# Patient Record
Sex: Female | Born: 1990
Health system: Southern US, Community
[De-identification: ages and names within clinical notes are randomized; demographics above are authoritative.]

## PROBLEM LIST (undated history)

## (undated) DIAGNOSIS — M549 Dorsalgia, unspecified: Secondary | ICD-10-CM

## (undated) DIAGNOSIS — I639 Cerebral infarction, unspecified: Secondary | ICD-10-CM

## (undated) DIAGNOSIS — E119 Type 2 diabetes mellitus without complications: Secondary | ICD-10-CM

## (undated) DIAGNOSIS — D571 Sickle-cell disease without crisis: Secondary | ICD-10-CM

## (undated) DIAGNOSIS — G8929 Other chronic pain: Secondary | ICD-10-CM

## (undated) HISTORY — PX: CHOLECYSTECTOMY: SHX55

## (undated) HISTORY — PX: BONE MARROW TRANSPLANT: SHX200

## (undated) HISTORY — PX: APPENDECTOMY: SHX54

## (undated) HISTORY — PX: UPPER GASTROINTESTINAL ENDOSCOPY: SHX188

---
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
## 2015-07-09 LAB — HEPATIC FUNCTION PANEL
ALT: 14 U/L (ref 7–35)
AST: 18 U/L (ref 13–35)
Alkaline Phosphatase: 99 U/L (ref 25–125)
BILIRUBIN, TOTAL: 0.8 mg/dL

## 2015-07-09 LAB — LIPID PANEL
Cholesterol: 131 mg/dL (ref 0–200)
HDL: 50 mg/dL (ref 35–70)
LDL CALC: 61 mg/dL
Triglycerides: 100 mg/dL (ref 40–160)

## 2015-07-09 LAB — BASIC METABOLIC PANEL
BUN: 7 mg/dL (ref 4–21)
Creatinine: 0.7 mg/dL (ref 0.5–1.1)
GLUCOSE: 275 mg/dL
Potassium: 4.4 mmol/L (ref 3.4–5.3)
SODIUM: 135 mmol/L — AB (ref 137–147)

## 2015-09-01 ENCOUNTER — Emergency Department (HOSPITAL_COMMUNITY)
Admission: EM | Admit: 2015-09-01 | Discharge: 2015-09-01 | Disposition: A | Discharge status: Home / Self Care | Payer: Medicare Other | Attending: Emergency Medicine | Admitting: Emergency Medicine

## 2015-09-01 ENCOUNTER — Encounter (HOSPITAL_COMMUNITY): Payer: Self-pay | Admitting: *Deleted

## 2015-09-01 DIAGNOSIS — E109 Type 1 diabetes mellitus without complications: Secondary | ICD-10-CM | POA: Insufficient documentation

## 2015-09-01 DIAGNOSIS — Z862 Personal history of diseases of the blood and blood-forming organs and certain disorders involving the immune mechanism: Secondary | ICD-10-CM | POA: Diagnosis not present

## 2015-09-01 DIAGNOSIS — G8929 Other chronic pain: Secondary | ICD-10-CM | POA: Insufficient documentation

## 2015-09-01 DIAGNOSIS — Z87891 Personal history of nicotine dependence: Secondary | ICD-10-CM | POA: Diagnosis not present

## 2015-09-01 DIAGNOSIS — R112 Nausea with vomiting, unspecified: Secondary | ICD-10-CM | POA: Diagnosis not present

## 2015-09-01 DIAGNOSIS — Z3202 Encounter for pregnancy test, result negative: Secondary | ICD-10-CM | POA: Insufficient documentation

## 2015-09-01 DIAGNOSIS — Z8673 Personal history of transient ischemic attack (TIA), and cerebral infarction without residual deficits: Secondary | ICD-10-CM | POA: Diagnosis not present

## 2015-09-01 DIAGNOSIS — M545 Low back pain: Secondary | ICD-10-CM | POA: Diagnosis not present

## 2015-09-01 DIAGNOSIS — M549 Dorsalgia, unspecified: Secondary | ICD-10-CM

## 2015-09-01 HISTORY — DX: Type 2 diabetes mellitus without complications: E11.9

## 2015-09-01 HISTORY — DX: Dorsalgia, unspecified: M54.9

## 2015-09-01 HISTORY — DX: Sickle-cell disease without crisis: D57.1

## 2015-09-01 HISTORY — DX: Cerebral infarction, unspecified: I63.9

## 2015-09-01 HISTORY — DX: Other chronic pain: G89.29

## 2015-09-01 LAB — URINALYSIS, ROUTINE W REFLEX MICROSCOPIC
Bilirubin Urine: NEGATIVE
Glucose, UA: NEGATIVE mg/dL
HGB URINE DIPSTICK: NEGATIVE
Ketones, ur: NEGATIVE mg/dL
Leukocytes, UA: NEGATIVE
NITRITE: NEGATIVE
PH: 6 (ref 5.0–8.0)
Protein, ur: NEGATIVE mg/dL
SPECIFIC GRAVITY, URINE: 1.014 (ref 1.005–1.030)

## 2015-09-01 LAB — POC URINE PREG, ED: Preg Test, Ur: NEGATIVE

## 2015-09-01 MED ORDER — OXYCODONE-ACETAMINOPHEN 5-325 MG PO TABS
1.0000 | ORAL_TABLET | Freq: Once | ORAL | Status: AC
  Administered 2015-09-01: 1 via ORAL

## 2015-09-01 MED ORDER — KETOROLAC TROMETHAMINE 30 MG/ML IJ SOLN
30.0000 mg | Freq: Once | INTRAMUSCULAR | Status: AC
  Administered 2015-09-01: 30 mg via INTRAMUSCULAR
  Filled 2015-09-01: qty 1

## 2015-09-01 MED ORDER — METHOCARBAMOL 500 MG PO TABS
500.0000 mg | ORAL_TABLET | Freq: Once | ORAL | Status: AC
  Administered 2015-09-01: 500 mg via ORAL
  Filled 2015-09-01: qty 1

## 2015-09-01 MED ORDER — METHOCARBAMOL 500 MG PO TABS
500.0000 mg | ORAL_TABLET | Freq: Two times a day (BID) | ORAL | Status: DC
Start: 2015-09-01 — End: 2015-09-09

## 2015-09-01 MED ORDER — OXYCODONE-ACETAMINOPHEN 5-325 MG PO TABS
1.0000 | ORAL_TABLET | Freq: Once | ORAL | Status: AC
  Administered 2015-09-01: 1 via ORAL
  Filled 2015-09-01: qty 1

## 2015-09-01 MED ORDER — OXYCODONE-ACETAMINOPHEN 5-325 MG PO TABS
ORAL_TABLET | ORAL | Status: AC
  Filled 2015-09-01: qty 1

## 2015-09-01 MED ORDER — ACETAMINOPHEN 500 MG PO TABS: 500.0000 mg | ORAL_TABLET | Freq: Four times a day (QID) | ORAL | Status: DC | PRN

## 2015-09-01 MED ORDER — IBUPROFEN 800 MG PO TABS
800.0000 mg | ORAL_TABLET | Freq: Three times a day (TID) | ORAL | Status: DC
Start: 2015-09-01 — End: 2016-02-16

## 2015-09-01 NOTE — ED Provider Notes (Signed)
CSN: [id number]     Arrival date & time 09/01/15  1458 History  By signing my name below, I, Elon Spanner, attest that this documentation has been prepared under the direction and in the presence of Glean Hess, [place]. Electronically Signed: Elon Spanner ED Scribe. 09/01/2015. 4:59 PM.    Chief Complaint  Patient presents with  . Back Pain    The history is provided by the patient. No language interpreter was used.    HPI Comments: Jo Pittman is a 25 y.o. female with hx of DM, back pain, sickle cell anemia who presents to the Emergency Department complaining of chronic, constant lower back pain that worsened two weeks ago.  She currently also reports a sensation of new, intermittent, left > right muscle spasms. There are no aggravating factors.  She also notes recent nausea and vomiting precipitated by eating.  The patient states she moved from [place] one month ago where she was followed by pain management.  She is scheduled to establish care with a new clinic on 1/24.  The pain has been unrelieved by oxycodone, diclofenac, diazepam, and tylenol.  She ran out of oxycodone recently.  She denies fever, chills, CP, SOB, abdominal pain, numbness/tingling/weakness, bowel/bladder incontinence, dysuria, frequency. She denies hx of CA or use of anti-coagulants.  She states she had a bone marrow transplant after having a stroke in 2008, and no longer has sickle cell anemia. She reports her symptoms are not consistent with pain crisis.   Past Medical History  Diagnosis Date  . Diabetes mellitus without complication (HCC)     type I  . Back pain, chronic   . Sickle cell anemia (HCC)     HX of, no longer  . Stroke [facility]) 2008    R sided weakness/aneurism   Past Surgical History  Procedure Laterality Date  . Bone marrow transplant    . Cholecystectomy    . Appendectomy     No family history on file. Social History  Substance Use Topics  . Smoking status: Former Games developer  . Smokeless  tobacco: None  . Alcohol Use: Yes     Comment: occ   OB History    No data available      Review of Systems  Constitutional: Negative for fever and chills.  Respiratory: Negative for shortness of breath.   Cardiovascular: Negative for chest pain.  Gastrointestinal: Positive for nausea and vomiting. Negative for abdominal pain.  Genitourinary: Negative for dysuria and frequency.  Musculoskeletal: Positive for back pain.  Neurological: Negative for syncope, weakness and numbness.  All other systems reviewed and are negative.    Allergies  Carafate and Vancomycin  Home Medications   Prior to Admission medications   Medication Sig Start Date End Date Taking? Authorizing Provider  acetaminophen (TYLENOL) 500 MG tablet Take 1 tablet (500 mg total) by mouth every 6 (six) hours as needed. 09/01/15   Mady Gemma, PA-C  ibuprofen (ADVIL,MOTRIN) 800 MG tablet Take 1 tablet (800 mg total) by mouth 3 (three) times daily. 09/01/15   Mady Gemma, PA-C  methocarbamol (ROBAXIN) 500 MG tablet Take 1 tablet (500 mg total) by mouth 2 (two) times daily. 09/01/15   Mady Gemma, PA-C    BP 102/74 mmHg  Pulse 84  Temp(Src) 98.7 F (37.1 C) (Oral)  Resp 20  Ht 5\' 6"  (1.676 m)  Wt 45.36 kg  BMI 16.15 kg/m2  SpO2 100%  LMP 08/17/2015 Physical Exam  Constitutional: She is oriented to person, place,  and time. She appears well-developed and well-nourished. No distress.  HENT:  Head: Normocephalic and atraumatic.  Right Ear: External ear normal.  Left Ear: External ear normal.  Nose: Nose normal.  Mouth/Throat: Uvula is midline, oropharynx is clear and moist and mucous membranes are normal.  Eyes: Conjunctivae, EOM and lids are normal. Pupils are equal, round, and reactive to light. Right eye exhibits no discharge. Left eye exhibits no discharge. No scleral icterus.  Neck: Normal range of motion. Neck supple.  Cardiovascular: Normal rate, regular rhythm, normal heart  sounds, intact distal pulses and normal pulses.   Pulmonary/Chest: Effort normal and breath sounds normal. No respiratory distress. She has no wheezes. She has no rales.  Abdominal: Soft. Normal appearance and bowel sounds are normal. She exhibits no distension and no mass. There is no tenderness. There is no rigidity, no rebound and no guarding.  Musculoskeletal: Normal range of motion. She exhibits tenderness. She exhibits no edema.  TTP of left lumbar paraspinal muscles. No midline tenderness, step-off, or deformity.   Neurological: She is alert and oriented to person, place, and time. She has normal strength and normal reflexes. No sensory deficit.  Skin: Skin is warm, dry and intact. No rash noted. She is not diaphoretic. No erythema. No pallor.  Psychiatric: She has a normal mood and affect. Her speech is normal and behavior is normal.  Nursing note and vitals reviewed.   ED Course  Procedures (including critical care time)  DIAGNOSTIC STUDIES: Oxygen Saturation is 99% on RA, normal by my interpretation.    COORDINATION OF CARE: 5:06 PM Will order pain medication, muscle relaxant, and labs.  Patient acknowledges and agrees with plan.    Labs Review Labs Reviewed  URINALYSIS, ROUTINE W REFLEX MICROSCOPIC (NOT AT [facility])  POC URINE PREG, ED    Imaging Review No results found.   I have personally reviewed and evaluated these lab results as part of my medical decision-making.   EKG Interpretation None      MDM   Final diagnoses:  Back pain, unspecified location    25 year old female presents with left-sided back pain. States she has chronic back pain, and recently ran out of her oxycodone, which was prescribed by her pain clinic in [place]. She reports worsening pain with associated muscle spasms. She denies fever, chills, bowel or bladder incontinence, saddle anesthesia, numbness, weakness, paresthesia, history of malignancy, IV drug use, anticoagulant use.  Patient is  afebrile. Vital signs stable. On exam, she has tenderness to palpation of her left lumbar paraspinal muscles. No midline tenderness, step-off, or deformity. Strength, sensation, DTRs intact. Patient ambulates without difficulty.  UA negative for infection. Urine pregnancy negative.  Patient given percocet, robaxin, toradol for pain. She reports significant symptom improvement, and states she is now comfortable.   Patient is nontoxic and well-appearing, feel she is stable for discharge at this time. Doubt cauda equina, abscess, hematoma. Symptoms likely muscular with chronic pain contributing. Patient to follow-up with PCP as scheduled for possible referral to pain clinic in [place]. Recommended patient take tylenol or ibuprofen for pain at home. Will also give muscle relaxant for muscle spasm. Return precautions discussed. Patient verbalizes her understanding and is in agreement with plan.  BP 102/74 mmHg  Pulse 84  Temp(Src) 98.7 F (37.1 C) (Oral)  Resp 20  Ht 5\' 6"  (1.676 m)  Wt 45.36 kg  BMI 16.15 kg/m2  SpO2 100%  LMP 08/17/2015  I personally performed the services described in this documentation, which was scribed  in my presence. The recorded information has been reviewed and is accurate.    Mady Gemmalizabeth C Shericka Johnstone, PA-C 09/01/15 1854  Leta BaptistEmily Roe Nguyen, MD 09/05/15 [phone]

## 2015-09-01 NOTE — ED Notes (Addendum)
[place]  [place] pain to L hip and has run out of all her pain meds except for muscle relaxers.  Pt normally takes oxycodone 15 mg for pain.  Hx of sickle cell - pt states she no longer has it d/t a bone marrow transplant.

## 2015-09-01 NOTE — Discharge Instructions (Signed)
1. Medications: tylenol, ibuprofen, and robaxin for pain, usual home medications 2. Treatment: rest, drink plenty of fluids, use heat/ice 3. Follow Up: please followup with your primary doctor as scheduled on the 24th for discussion of your diagnoses and further evaluation after today's visit; if you do not have a primary care doctor use the resource guide provided to find one; please return to the ER for increased pain, numbness, weakness, new or worsening symptoms   Back Pain, Adult Back pain is very common in adults.The cause of back pain is rarely dangerous and the pain often gets better over time.The cause of your back pain may not be known. Some common causes of back pain include:  Strain of the muscles or ligaments supporting the spine.  Wear and tear (degeneration) of the spinal disks.  Arthritis.  Direct injury to the back. For many people, back pain may return. Since back pain is rarely dangerous, most people can learn to manage this condition on their own. HOME CARE INSTRUCTIONS Watch your back pain for any changes. The following actions may help to lessen any discomfort you are feeling:  Remain active. It is stressful on your back to sit or stand in one place for long periods of time. Do not sit, drive, or stand in one place for more than 30 minutes at a time. Take short walks on even surfaces as soon as you are able.Try to increase the length of time you walk each day.  Exercise regularly as directed by your health care provider. Exercise helps your back heal faster. It also helps avoid future injury by keeping your muscles strong and flexible.  Do not stay in bed.Resting more than 1-2 days can delay your recovery.  Pay attention to your body when you bend and lift. The most comfortable positions are those that put less stress on your recovering back. Always use proper lifting techniques, including:  Bending your knees.  Keeping the load close to your body.  Avoiding  twisting.  Find a comfortable position to sleep. Use a firm mattress and lie on your side with your knees slightly bent. If you lie on your back, put a pillow under your knees.  Avoid feeling anxious or stressed.Stress increases muscle tension and can worsen back pain.It is important to recognize when you are anxious or stressed and learn ways to manage it, such as with exercise.  Take medicines only as directed by your health care provider. Over-the-counter medicines to reduce pain and inflammation are often the most helpful.Your health care provider may prescribe muscle relaxant drugs.These medicines help dull your pain so you can more quickly return to your normal activities and healthy exercise.  Apply ice to the injured area:  Put ice in a plastic bag.  Place a towel between your skin and the bag.  Leave the ice on for 20 minutes, 2-3 times a day for the first 2-3 days. After that, ice and heat may be alternated to reduce pain and spasms.  Maintain a healthy weight. Excess weight puts extra stress on your back and makes it difficult to maintain good posture. SEEK MEDICAL CARE IF:  You have pain that is not relieved with rest or medicine.  You have increasing pain going down into the legs or buttocks.  You have pain that does not improve in one week.  You have night pain.  You lose weight.  You have a fever or chills. SEEK IMMEDIATE MEDICAL CARE IF:   You develop new bowel or  bladder control problems.  You have unusual weakness or numbness in your arms or legs.  You develop nausea or vomiting.  You develop abdominal pain.  You feel faint.   This information is not intended to replace advice given to you by your health care provider. Make sure you discuss any questions you have with your health care provider.   Document Released: 08/02/2005 Document Revised: 08/23/2014 Document Reviewed: 12/04/2013 Elsevier Interactive Patient Education 2016 [employer]  [employer]   Emergency Department Resource Guide 1) Find a Doctor and Pay Out of Pocket Although you won't have to find out who is covered by your insurance plan, it is a good idea to ask around and get recommendations. You will then need to call the office and see if the doctor you have chosen will accept you as a new patient and what types of options they offer for patients who are self-pay. Some doctors offer discounts or will set up payment plans for their patients who do not have insurance, but you will need to ask so you aren't surprised when you get to your appointment.  2) Contact Your Local Health Department Not all health departments have doctors that can see patients for sick visits, but many do, so it is worth a call to see if yours does. If you don't know where your local health department is, you can check in your phone book. The CDC also has a tool to help you locate your state's health department, and many state websites also have listings of all of their local health departments.  3) Find a Walk-in Clinic If your illness is not likely to be very severe or complicated, you may want to try a walk in clinic. These are popping up all over the country in pharmacies, drugstores, and shopping centers. They're usually staffed by nurse practitioners or physician assistants that have been trained to treat common illnesses and complaints. They're usually fairly quick and inexpensive. However, if you have serious medical issues or chronic medical problems, these are probably not your best option.  No Primary Care Doctor: - Call Health Connect at  [phone] - they can help you locate a primary care doctor that  accepts your insurance, provides certain services, etc. - Physician Referral Service- [phone]  Chronic Pain Problems: Organization         Address  Phone   Notes  [facility]  [phone]) [phone] Patients need to be referred by their primary care doctor.   Medication  Assistance: Organization         Address  Phone   Notes  [facility] Medication [facility] [address] [place], [place] [address] [phone]) [phone] --Must be a resident of [facility] [place] -- Must have NO insurance coverage whatsoever (no Medicaid/ Medicare, etc.) -- The pt. MUST have a primary care doctor that directs their care regularly and follows them in the community   MedAssist  [phone]   [employer]  [phone]    Agencies that provide inexpensive medical care: Organization         Address  Phone   Notes  [facility]  [phone](336) [phone]   [facility]    [phone]) [phone]   [facility] [address] [place], [place] [address] [phone]) [phone]   [facility] [address], [place] [phone]   Planned Parenthood    [phone](336) [phone]   [facility]    (  [phone]) [phone]   [facility]  [address], [place] Phone:  [phone](336) [phone], Fax:  [phone] Hours of Operation:  9 am - 6 pm, M-F.  Also accepts Medicaid/Medicare and self-pay.  [facility]  [address], [place] Phone: [phone](336) [phone], Fax: [phone]) [phone]. Hours of Operation:  8:30 am - 5:30 pm, M-F.  Also accepts Medicaid and self-pay.  [facility] [place] [address], [place] Phone: [phone](336) [phone]   [facility] [address], [place], [place] [phone], Ext. [phone] Mondays & Thursdays: 7-9 AM.  First 15 patients are seen on a first come, first serve basis.    Medicaid-accepting [facility] Providers:  Organization         Address  Phone   Notes  [facility] [address], [place] [phone] Also accepts self-pay patients.  [facility] [address], [place]  [phone]) [phone]   [facility] [address], [place]  [phone](336) [phone]   [facility] [address] [place] [address], [place] [phone](336) [phone]   Renaye RakersVeita Bland [address], [place]   [phone]) [phone] Only accepts [place] Access [place] patients after they have their name applied to their card.   Self-Pay (no insurance) in [facility]:  Organization         Address  Phone   Notes  Sickle Cell Patients, [facility] [address], [place] [phone](336) [phone]   [facility] [address], [place] [phone]) [phone]   [facility]  [address], [place] [phone]) [phone]   [facility]/Dr. Osei-Bonsu  [address] [place] [address], [place] or [id number], [place] [phone]) [phone] Phone number for both [place] and [place] locations is the same.  [facility] [address], [place] [phone](336) [phone]   [facility] [address] [place] [address], [place] or [address] [phone](336) [phone]) [phone]   [facility] [address], [place] [phone](336) [phone], phone; [phone](336) [phone], fax Sees patients 1st and 3rd Saturday of every month.  Must not qualify for public or private insurance (i.e. Medicaid, Medicare, [place] Health Choice, Veterans' Benefits)  Household income should be no more than 200% of the poverty level The clinic cannot treat you if you are pregnant or think you are pregnant  Sexually transmitted diseases are not treated at the clinic.    Dental Care: Organization         Address  Phone  Notes  [facility] Department of [facility] [address], [place] [phone]) [phone] Accepts children up to age 26 who are enrolled in [place]icaid or [place] Health Choice; pregnant women with a Medicaid card; and children who have applied for Medicaid or [place] Health Choice, but were declined, whose parents can pay a reduced fee at time of service.  [facility]  Department of [facility] [place]  [address], [place] [phone] Accepts children up to age 26 who are enrolled in [place]icaid or [place] Health Choice; pregnant women with a Medicaid card; and children who have applied for Medicaid or [place] Health Choice, but were declined, whose parents can pay a reduced fee at time of service.  [facility]  [address] [place], [place] [phone] Patients are seen by appointment only. Walk-ins are not accepted. [facility] will see patients 15 years of age and older. Monday - Tuesday (8am-5pm) Most Wednesdays (8:30-5pm) $30 per visit, cash only  [facility] Access PRO[facility]  [address], [facility] [phone] Patients are seen by appointment only. Walk-ins are not accepted. [facility] will see patients 64 years of age and older. One Wednesday Evening (Monthly: Volunteer Based).  $30 per visit, cash only  [employer]  [phone] for adults; Children under age 75, call Graduate Pediatric Dentistry at [phone]. Children aged 86-14, please call [phone] to request a pediatric application.  Dental services are provided in all areas of dental care including fillings, crowns and bridges, complete and partial dentures, implants, gum treatment, root canals, and extractions. Preventive care is also provided. Treatment is provided to both adults and children. Patients are selected via a lottery and there is often a waiting list.   [facility] [address] [place]  [phone] [email or web address]   [facility] [address] [place], [place] [phone], Ext. [phone] Second and Fourth Thursday of each month, opens at 6:30 AM; Clinic ends at 9 AM.  Patients are seen on a first-come first-served basis, and a limited number are seen during each clinic.   [facility]  [address] Ether [place], [place] [phone]    Eligibility Requirements You must have lived in [place], [place], or [place] counties for at least the last three months.   You cannot be eligible for state or federal sponsored [place], including [employer], [place], or [employer].   You generally cannot be eligible for healthcare insurance through your employer.    How to apply: Eligibility screenings are held every Tuesday and Wednesday afternoon from 1:00 pm until 4:00 pm. You do not need an appointment for the interview!  [facility] [address], [place], [place] [phone]   [facility] Health Department  [phone]   [facility] Health Department  [phone]   [facility] Health Department  [phone]    Behavioral Health Resources in the Community: Intensive Outpatient Programs Organization         Address  Phone  Notes  [facility] [address] [place], [place] [phone]   [facility]patient [address], [place], [place] [phone]   [facility]: Alcohol [facility] Drug S[facility]cs [address], [place], [place]  [phone]   [facility] Mental Health [address],  [place], [place] [phone] or [phone]   Substance Abuse Resources Organization         Address  Phone  Notes  [facility] Drug Services  [phone]   [facility]  [phone]   [facility] [place]  [phone]   Floydene Flock  [phone]   Residential & Outpatient Substance Abuse Program  [phone]   Psychological Services Organization         Address  Phone  Notes  [facility] Behavioral Health  [phone]   [facility]  [phone]   [facility] Mental Health [address], [place] [phone] or [phone]    Mobile Crisis Teams Organization         Address  Phone  Notes  [facility], Mobile Crisis Care Unit  [phone]   [facility]  [address]  [place],  [place] [phone]   [facility] [address] [place] [phone]    Self-Help/Support Groups Organization         Address  Phone             Notes  Mental Health Assoc. of [place] - variety of support groups  336- [phone] Call for more information  Narcotics Anonymous (NA), [facility] [address], [place]  2 meetings at this location   Statisticianesidential Treatment Programs Organization         Address  Phone  Notes  [facility] [address],    [place]  [phone]   [facility]  [address], [place] [phone], [place], [place] [phone]   [facility] [address], [place] [phone] Admissions: 8am-3pm M-F  [facility] [address]    [place], [place] [phone]   [facility] [address], [place], [place] [phone]   [facility] [address]  [place], [place] [phone]   [facility] - Intensive Outpatient [address], [place], [place] [phone]   [facility] ([facility] [address]  [place], [place] [phone] or [phone]   [facility] ([facility]) [address] [place], [place] [phone] Accepts Medicaid  [facility] [address]  [place] [phone] Substance Abuse/Addiction Treatment   [facility] Behavioral Health Resources Organization         Address  Phone  Notes  [facility]  [phone]) [phone]   Angie FavaJulie Brannon, PhD [address], [address] [place], [place]   [phone]) [phone] or [phone](336) [phone]   [facility]   [address] [place], [place] [phone](336) [phone]   [facility] [address], [place], [place] [phone](336) [phone] Insurance/Medicaid/sponsorship through [facility]  Faith and Families [address]                                    [place], [place] [phone](336) [phone] Therapy/tele-psych/case    [facility] [address].   [place], [place] [phone](336) [phone]    Dr. Lolly MustacheArfeen  [phone](336) [phone]   [facility] [place]  [employer] [facility]. 1) [address],  2) [address], [place] 3)  [address], [place] [phone]) [phone]) [phone]  [phone](336) [phone]   [facility] Child Abuse Hotline [phone](336) [phone] or [phone](336) [phone] (After Hours)

## 2015-09-09 ENCOUNTER — Ambulatory Visit (INDEPENDENT_AMBULATORY_CARE_PROVIDER_SITE_OTHER): Payer: Medicare Other | Admitting: Family Medicine

## 2015-09-09 ENCOUNTER — Encounter: Payer: Self-pay | Admitting: Family Medicine

## 2015-09-09 VITALS — BP 121/77 | HR 108 | Temp 98.2°F | Resp 14 | Ht 66.0 in | Wt 101.0 lb

## 2015-09-09 DIAGNOSIS — M5440 Lumbago with sciatica, unspecified side: Secondary | ICD-10-CM | POA: Insufficient documentation

## 2015-09-09 DIAGNOSIS — J4520 Mild intermittent asthma, uncomplicated: Secondary | ICD-10-CM

## 2015-09-09 DIAGNOSIS — F411 Generalized anxiety disorder: Secondary | ICD-10-CM

## 2015-09-09 DIAGNOSIS — Z862 Personal history of diseases of the blood and blood-forming organs and certain disorders involving the immune mechanism: Secondary | ICD-10-CM

## 2015-09-09 DIAGNOSIS — G894 Chronic pain syndrome: Secondary | ICD-10-CM | POA: Insufficient documentation

## 2015-09-09 DIAGNOSIS — E109 Type 1 diabetes mellitus without complications: Secondary | ICD-10-CM

## 2015-09-09 DIAGNOSIS — F32A Depression, unspecified: Secondary | ICD-10-CM | POA: Insufficient documentation

## 2015-09-09 DIAGNOSIS — E559 Vitamin D deficiency, unspecified: Secondary | ICD-10-CM

## 2015-09-09 DIAGNOSIS — F329 Major depressive disorder, single episode, unspecified: Secondary | ICD-10-CM | POA: Diagnosis not present

## 2015-09-09 DIAGNOSIS — M5441 Lumbago with sciatica, right side: Secondary | ICD-10-CM

## 2015-09-09 DIAGNOSIS — R42 Dizziness and giddiness: Secondary | ICD-10-CM

## 2015-09-09 LAB — COMPLETE METABOLIC PANEL WITH GFR
ALT: 15 U/L (ref 6–29)
AST: 18 U/L (ref 10–30)
Albumin: 5.1 g/dL (ref 3.6–5.1)
Alkaline Phosphatase: 115 U/L (ref 33–115)
BUN: 8 mg/dL (ref 7–25)
CHLORIDE: 102 mmol/L (ref 98–110)
CO2: 27 mmol/L (ref 20–31)
CREATININE: 0.71 mg/dL (ref 0.50–1.10)
Calcium: 10 mg/dL (ref 8.6–10.2)
GFR, Est Non African American: >89 mL/min (ref >=60)
Glucose, Bld: 48 mg/dL — ABNORMAL LOW (ref 65–99)
Potassium: 3.8 mmol/L (ref 3.5–5.3)
Sodium: 138 mmol/L (ref 135–146)
Total Bilirubin: 0.5 mg/dL (ref 0.2–1.2)
Total Protein: 7.6 g/dL (ref 6.1–8.1)

## 2015-09-09 LAB — POCT URINALYSIS DIP (DEVICE)
Bilirubin Urine: NEGATIVE
Glucose, UA: 500 mg/dL — AB
HGB URINE DIPSTICK: NEGATIVE
Ketones, ur: NEGATIVE mg/dL
Leukocytes, UA: NEGATIVE
NITRITE: NEGATIVE
PROTEIN: NEGATIVE mg/dL
Specific Gravity, Urine: 1.015 (ref 1.005–1.030)
Urobilinogen, UA: 0.2 mg/dL (ref 0.0–1.0)
pH: 6 (ref 5.0–8.0)

## 2015-09-09 LAB — CBC WITH DIFFERENTIAL/PLATELET
BASOS ABS: 0 10*3/uL (ref 0.0–0.1)
Basophils Relative: 0 % (ref 0–1)
EOS PCT: 0 % (ref 0–5)
Eosinophils Absolute: 0 10*3/uL (ref 0.0–0.7)
HEMATOCRIT: 36.7 % (ref 36.0–46.0)
HEMOGLOBIN: 12.3 g/dL (ref 12.0–15.0)
LYMPHS ABS: 3.3 10*3/uL (ref 0.7–4.0)
LYMPHS PCT: 39 % (ref 12–46)
MCH: 34.2 pg — ABNORMAL HIGH (ref 26.0–34.0)
MCHC: 33.5 g/dL (ref 30.0–36.0)
MCV: 101.9 fL — AB (ref 78.0–100.0)
MPV: 10.3 fL (ref 8.6–12.4)
Monocytes Absolute: 1.1 10*3/uL — ABNORMAL HIGH (ref 0.1–1.0)
Monocytes Relative: 13 % — ABNORMAL HIGH (ref 3–12)
NEUTROS ABS: 4 10*3/uL (ref 1.7–7.7)
Neutrophils Relative %: 48 % (ref 43–77)
Platelets: 201 10*3/uL (ref 150–400)
RBC: 3.6 MIL/uL — ABNORMAL LOW (ref 3.87–5.11)
RDW: 12.7 % (ref 11.5–15.5)
WBC: 8.4 10*3/uL (ref 4.0–10.5)

## 2015-09-09 MED ORDER — KETOROLAC TROMETHAMINE 60 MG/2ML IM SOLN
30.0000 mg | Freq: Once | INTRAMUSCULAR | Status: AC
Start: 2015-09-09 — End: 2015-09-09
  Administered 2015-09-09: 30 mg via INTRAMUSCULAR

## 2015-09-09 MED ORDER — METHOCARBAMOL 500 MG PO TABS: 500.0000 mg | ORAL_TABLET | Freq: Two times a day (BID) | ORAL | Status: DC

## 2015-09-09 NOTE — Progress Notes (Signed)
Subjective:    Patient ID: Jo Pittman, female    DOB: 02-04-1991, 25 y.o.   MRN: [id number]  HPI Ms. Jo Pittman, a 25 year old female with type I diabetes, chronic back pain, depression, sickle cell anemia, and vitamin D deficiency presents to establish care. Ms. Jo Pittman recently relocated from [place]. She states that she was a patient of Dr. Anastasia Pall at [facility]. Patient states that she relocated greater than 1 month ago and has been using the emergency department and urgent care for all primary care needs. Ms. Jo Pittman has a history of sickle cell anemia. She received a bone marrow transplant years ago that was successful.    Patient is also here for an evaluation of Type 1 diabetes mellitus.  The initial diagnosis of diabetes was made as a teenager.  Her clinical course has been stable since being on the insulin pump. Associated symptoms of hyperglycemia have been blurry vision and polyuria.  She currently denies symptoms of hypoglycemia. Patient was under the care of an endocrinologist while living in . She maintains that compliance with glucose monitoring has been good. She states that meal planning has been been poor since relocating.  Ms. Jo Pittman is also complaining of chronic back pain. She maintains that she has a history of degenerative disc disease. She was under the care of pain management. Pain intensity at present is 6/10. She states that she was previously on Oxycontin 15 mg every 12 hours, which controlled pain. She has been out of medication over the past several weeks. She says that she continues to take high dose Ibuprofen and Robaxin for symptoms with minimal relief. She denies any previous back injury.  Exacerbating factors identifiable by patient are bending backwards, bending forwards, bending sideways, recumbency, running, sitting, standing and walking. She says that she has had a back MRI and was previous evaluated by an  orthopedic physician.   Ms. Jo Pittman also has a history of depression and anxiety.   She complains of anhedonia, depressed mood and insomnia. Onset was several years ago.   She denies current suicidal and homicidal plan or intent. Possible organic causes contributing are: endocrine/metabolic.  Risk factors include: previous episode of depression Previous treatment includes Wellbutrin and Xanax and Lexapro.  Patient also has a history of asthma that has been controlled. She was diagnosed with asthma as a child. She maintains that most exacerbations occur during season changes. Symptoms are controlled on symbicort twice daily.  Medications used in the past to treat these symptoms include beta agonist inhalers and inhaled steroids.   Past Medical History  Diagnosis Date  . Diabetes mellitus without complication (HCC)     type I  . Back pain, chronic   . Sickle cell anemia (HCC)     HX of, no longer  . Stroke [facility]) 2008    R sided weakness/aneurism   Past Surgical History  Procedure Laterality Date  . Bone marrow transplant    . Cholecystectomy    . Appendectomy     Social History   Social History  . Marital Status: Single    Spouse Name: N/A  . Number of Children: N/A  . Years of Education: N/A   Occupational History  . Not on file.   Social History Main Topics  . Smoking status: Former Games developer  . Smokeless tobacco: Not on file  . Alcohol Use: 2.4 oz/week    4 Shots of liquor per week     Comment: occ  .  Drug Use: Yes    Special: Marijuana  . Sexual Activity: Not on file   Other Topics Concern  . Not on file   Social History Narrative     Review of Systems  Constitutional: Negative.  Negative for fever.  HENT: Negative.   Eyes: Negative.   Respiratory: Negative.  Negative for apnea, chest tightness, shortness of breath and wheezing.   Cardiovascular: Negative.  Negative for palpitations and leg swelling.  Gastrointestinal: Negative.  Negative for blood in stool and  abdominal distention.  Endocrine: Positive for polyuria. Negative for polydipsia and polyphagia.  Genitourinary: Positive for menstrual problem (abnormal menstrual periods). Negative for dysuria, decreased urine volume and vaginal bleeding.  Musculoskeletal: Positive for myalgias and back pain.  Skin: Negative.   Allergic/Immunologic: Negative.  Negative for immunocompromised state.  Neurological: Negative.  Negative for dizziness and light-headedness.  Hematological: Negative.   Psychiatric/Behavioral: Negative.       Objective:   Physical Exam  Constitutional: She is oriented to person, place, and time. She appears well-developed and well-nourished.  HENT:  Head: Normocephalic and atraumatic.  Right Ear: External ear normal.  Left Ear: External ear normal.  Nose: Nose normal.  Mouth/Throat: Oropharynx is clear and moist.  Eyes: Conjunctivae and EOM are normal. Pupils are equal, round, and reactive to light.  Neck: Normal range of motion. Neck supple.  Cardiovascular: Normal rate, regular rhythm, normal heart sounds and intact distal pulses.   Pulmonary/Chest: Effort normal and breath sounds normal.  Abdominal: Soft. Bowel sounds are normal.  Musculoskeletal:       Lumbar back: She exhibits decreased range of motion. She exhibits no tenderness.  Neurological: She is alert and oriented to person, place, and time. She has normal reflexes.  Skin: Skin is warm and dry.  Psychiatric: She has a normal mood and affect. Her behavior is normal. Judgment and thought content normal.     BP 121/77 mmHg  Pulse 108  Temp(Src) 98.2 F (36.8 C) (Oral)  Resp 14  Ht  (1.676 m)  Wt 101 lb (45.813 kg)  BMI 16.31 kg/m2  LMP 08/22/2015 Assessment & Plan:  1. Type 1 diabetes mellitus without complication (HCC) Ms. Jo Pittman currently has an insulin pump.She receives regular insulin 3 times per day per  Sent a medical records request to [facility], will review records as  they come available. Will send a referral to Dr. Casimiro Needle [place], endocrinologist for further evaluation.  - Hemoglobin A1c - COMPLETE METABOLIC PANEL WITH GFR - POCT urinalysis dipstick  2. Low back pain with right-sided sciatica, unspecified back pain laterality - Ambulatory referral to Pain Clinic - ketorolac (TORADOL) injection 30 mg; Inject 1 mL (30 mg total) into the muscle once. - methocarbamol (ROBAXIN) 500 MG tablet; Take 1 tablet (500 mg total) by mouth 2 (two) times daily.  Dispense: 60 tablet; Refill: 1  3. Chronic pain syndrome - Ambulatory referral to Pain Clinic  4. Depression Continue Welbutrin and Lexapro as previously prescribed. She currently denies suicidal or homicidal intent. Will send a referral for psychiatry. Patient is requesting Xanax, explained that xanax is not recommended for long term use. She states that she has been out of medication since relocating to area.  - Ambulatory referral to Psychiatry  5. Anxiety state Refer to #4 - Ambulatory referral to Psychiatry  6. Vitamin D deficiency - Vitamin D, 25-hydroxy  7. Asthma, mild intermittent, uncomplicated - montelukast (SINGULAIR) 10 MG tablet; Take 1 tablet (10 mg total) by mouth  at bedtime.  Dispense: 30 tablet; Refill: 3  8. History of sickle cell anemia Ms. Jo Pittman had a bone marrow transplant. Will review medical records as they come available.  - CBC with Differential    RTC: 1 month for a follow up of chronic conditions  Massie Maroon, FNP

## 2015-09-09 NOTE — Patient Instructions (Addendum)
Awaiting records from [facility] for review Will send referral to pain management for history of chronic pain Will send referral to endocrinology for DMType I Will also continue medication to psychiatry for history of depression and anxiety Chronic Pain Chronic pain can be defined as pain that is off and on and lasts for 3-6 months or longer. Many things cause chronic pain, which can make it difficult to make a diagnosis. There are many treatment options available for chronic pain. However, finding a treatment that works well for you may require trying various approaches until the right one is found. Many people benefit from a combination of two or more types of treatment to control their pain. SYMPTOMS  Chronic pain can occur anywhere in the body and can range from mild to very severe. Some types of chronic pain include:  Headache.  Low back pain.  Cancer pain.  Arthritis pain.  Neurogenic pain. This is pain resulting from damage to nerves. People with chronic pain may also have other symptoms such as:  Depression.  Anger.  Insomnia.  Anxiety. DIAGNOSIS  Your health care provider will help diagnose your condition over time. In many cases, the initial focus will be on excluding possible conditions that could be causing the pain. Depending on your symptoms, your health care provider may order tests to diagnose your condition. Some of these tests may include:   Blood tests.   CT scan.   MRI.   X-rays.   Ultrasounds.   Nerve conduction studies.  You may need to see a specialist.  TREATMENT  Finding treatment that works well may take time. You may be referred to a pain specialist. He or she may prescribe medicine or therapies, such as:   Mindful meditation or yoga.  Shots (injections) of numbing or pain-relieving medicines into the spine or area of pain.  Local electrical stimulation.  Acupuncture.   Massage therapy.   Aroma, color,  light, or sound therapy.   Biofeedback.   Working with a physical therapist to keep from getting stiff.   Regular, gentle exercise.   Cognitive or behavioral therapy.   Group support.  Sometimes, surgery may be recommended.  HOME CARE INSTRUCTIONS   Take all medicines as directed by your health care provider.   Lessen stress in your life by relaxing and doing things such as listening to calming music.   Exercise or be active as directed by your health care provider.   Eat a healthy diet and include things such as vegetables, fruits, fish, and lean meats in your diet.   Keep all follow-up appointments with your health care provider.   Attend a support group with others suffering from chronic pain. SEEK MEDICAL CARE IF:   Your pain gets worse.   You develop a new pain that was not there before.   You cannot tolerate medicines given to you by your health care provider.   You have new symptoms since your last visit with your health care provider.  SEEK IMMEDIATE MEDICAL CARE IF:   You feel weak.   You have decreased sensation or numbness.   You lose control of bowel or bladder function.   Your pain suddenly gets much worse.   You develop shaking.  You develop chills.  You develop confusion.  You develop chest pain.  You develop shortness of breath.  MAKE SURE YOU:  Understand these instructions.  Will watch your condition.  Will get help right away if you are not doing well  or get worse.   This information is not intended to replace advice given to you by your health care provider. Make sure you discuss any questions you have with your health care provider.   Document Released: 04/24/2002 Document Revised: 04/04/2013 Document Reviewed: 01/26/2013 Elsevier Interactive Patient Education [employer]

## 2015-09-10 ENCOUNTER — Encounter: Payer: Self-pay | Admitting: Family Medicine

## 2015-09-10 LAB — VITAMIN D 25 HYDROXY (VIT D DEFICIENCY, FRACTURES): Vit D, 25-Hydroxy: 13 ng/mL — ABNORMAL LOW (ref 30–100)

## 2015-09-10 LAB — HEMOGLOBIN A1C
HEMOGLOBIN A1C: 7.9 % — AB (ref <5.7)
MEAN PLASMA GLUCOSE: 180 mg/dL — AB (ref <117)

## 2015-09-10 MED ORDER — MONTELUKAST SODIUM 10 MG PO TABS: 10.0000 mg | ORAL_TABLET | Freq: Every day | ORAL | Status: DC

## 2015-09-30 ENCOUNTER — Ambulatory Visit (HOSPITAL_COMMUNITY): Payer: Medicare Other | Admitting: Psychiatry

## 2015-09-30 ENCOUNTER — Ambulatory Visit (INDEPENDENT_AMBULATORY_CARE_PROVIDER_SITE_OTHER): Payer: Medicare Other | Admitting: Family Medicine

## 2015-09-30 ENCOUNTER — Encounter: Payer: Self-pay | Admitting: Family Medicine

## 2015-09-30 VITALS — BP 117/84 | HR 78 | Temp 98.3°F | Resp 16 | Ht 66.0 in | Wt 107.0 lb

## 2015-09-30 DIAGNOSIS — R0609 Other forms of dyspnea: Secondary | ICD-10-CM

## 2015-09-30 DIAGNOSIS — R05 Cough: Secondary | ICD-10-CM | POA: Diagnosis not present

## 2015-09-30 DIAGNOSIS — R0989 Other specified symptoms and signs involving the circulatory and respiratory systems: Secondary | ICD-10-CM | POA: Insufficient documentation

## 2015-09-30 DIAGNOSIS — E109 Type 1 diabetes mellitus without complications: Secondary | ICD-10-CM

## 2015-09-30 DIAGNOSIS — R053 Chronic cough: Secondary | ICD-10-CM

## 2015-09-30 DIAGNOSIS — J45909 Unspecified asthma, uncomplicated: Secondary | ICD-10-CM | POA: Insufficient documentation

## 2015-09-30 DIAGNOSIS — R059 Cough, unspecified: Secondary | ICD-10-CM | POA: Insufficient documentation

## 2015-09-30 DIAGNOSIS — J4520 Mild intermittent asthma, uncomplicated: Secondary | ICD-10-CM

## 2015-09-30 DIAGNOSIS — R0600 Dyspnea, unspecified: Secondary | ICD-10-CM

## 2015-09-30 DIAGNOSIS — R0689 Other abnormalities of breathing: Secondary | ICD-10-CM

## 2015-09-30 LAB — POCT URINALYSIS DIP (DEVICE)
BILIRUBIN URINE: NEGATIVE
GLUCOSE, UA: NEGATIVE mg/dL
KETONES UR: NEGATIVE mg/dL
LEUKOCYTES UA: NEGATIVE
Nitrite: NEGATIVE
Protein, ur: NEGATIVE mg/dL
Specific Gravity, Urine: 1.015 (ref 1.005–1.030)
Urobilinogen, UA: 0.2 mg/dL (ref 0.0–1.0)
pH: 6 (ref 5.0–8.0)

## 2015-09-30 MED ORDER — HYDROCODONE-HOMATROPINE 5-1.5 MG/5ML PO SYRP: 5.0000 mL | ORAL_SOLUTION | Freq: Three times a day (TID) | ORAL | Status: DC | PRN

## 2015-09-30 NOTE — Progress Notes (Deleted)
   Subjective:    Patient ID: Jo Pittman, female    DOB: 20-Feb-1991, 25 y.o.   MRN: [id number]  Cough This is a recurrent problem. The current episode started in the past 7 days (patient went to Urgent Care and was treated with Levaquin because of history of Sickle Cell Anemia). The problem has been gradually worsening. The problem occurs constantly. The cough is non-productive. Associated symptoms include nasal congestion and rhinorrhea. Nothing aggravates the symptoms. She has tried OTC cough suppressant for the symptoms. The treatment provided no relief.   Past Medical History  Diagnosis Date  . Diabetes mellitus without complication (HCC)     type I  . Back pain, chronic   . Sickle cell anemia (HCC)     HX of, no longer  . Stroke [facility]) 2008    R sided weakness/aneurism   Allergies  Allergen Reactions  . Carafate [Sucralfate]   . Morphine And Related     Itching when given IV   . Vancomycin    Past Surgical History  Procedure Laterality Date  . Bone marrow transplant    . Cholecystectomy    . Appendectomy        Review of Systems  Constitutional: Negative.   HENT: Positive for rhinorrhea.   Eyes: Negative.   Respiratory: Positive for cough.   Cardiovascular: Negative.   Gastrointestinal: Negative.   Endocrine: Negative.   Genitourinary: Negative.   Musculoskeletal: Positive for back pain.  Skin: Negative.   Allergic/Immunologic: Negative.   Neurological: Negative.   Hematological: Negative.   Psychiatric/Behavioral: Negative.   All other systems reviewed and are negative.      Objective:   Physical Exam  Constitutional: She is oriented to person, place, and time. She appears well-developed and well-nourished.  HENT:  Head: Normocephalic and atraumatic.  Right Ear: External ear normal.  Left Ear: External ear normal.  Mouth/Throat: Oropharynx is clear and moist.  Eyes: Conjunctivae and EOM are normal. Pupils are equal, round, and reactive to light.   Neck: Normal range of motion. Neck supple.  Cardiovascular: Normal rate, regular rhythm, normal heart sounds and intact distal pulses.   Pulmonary/Chest: Effort normal.  Congestion  Abdominal: Soft. Bowel sounds are normal.  Musculoskeletal:  Limited ROM upon flexion of spine.  Neurological: She is alert and oriented to person, place, and time. She has normal reflexes.  Skin: Skin is warm and dry.  Psychiatric: She has a normal mood and affect. Her behavior is normal. Judgment and thought content normal.         BP 117/84 mmHg  Pulse 78  Temp(Src) 98.3 F (36.8 C)  Resp 16  Ht  (1.676 m)  Wt 107 lb (48.535 kg)  BMI 17.28 kg/m2  LMP 09/26/2015 Assessment & Plan:  1. Cough, persistent Patient has continued to have persistent cough.  Patient received Rx of Levaquin and Hycodan at Urgent Care, but continues to have cough and congestion.    - HYDROcodone-homatropine (HYCODAN) 5-1.5 MG/5ML syrup; Take 5 mLs by mouth every 8 (eight) hours as needed for cough.  Dispense: 120 mL; Refill: 0  2. Symptoms of upper respiratory infection (URI) See #1  3. Cough See #1  Raliegh Ip, RN [employer]y [employer] Student FNP ***

## 2015-09-30 NOTE — Patient Instructions (Signed)
Upper Respiratory Infection, Adult Most upper respiratory infections (URIs) are a viral infection of the air passages leading to the lungs. A URI affects the nose, throat, and upper air passages. The most common type of URI is nasopharyngitis and is typically referred to as "the common cold." URIs run their course and usually go away on their own. Most of the time, a URI does not require medical attention, but sometimes a bacterial infection in the upper airways can follow a viral infection. This is called a secondary infection. Sinus and middle ear infections are common types of secondary upper respiratory infections. Bacterial pneumonia can also complicate a URI. A URI can worsen asthma and chronic obstructive pulmonary disease (COPD). Sometimes, these complications can require emergency medical care and may be life threatening.  CAUSES Almost all URIs are caused by viruses. A virus is a type of germ and can spread from one person to another.  RISKS FACTORS You may be at risk for a URI if:   You smoke.   You have chronic heart or lung disease.  You have a weakened defense (immune) system.   You are very young or very old.   You have nasal allergies or asthma.  You work in crowded or poorly ventilated areas.  You work in health care facilities or schools. SIGNS AND SYMPTOMS  Symptoms typically develop 2-3 days after you come in contact with a cold virus. Most viral URIs last 7-10 days. However, viral URIs from the influenza virus (flu virus) can last 14-18 days and are typically more severe. Symptoms may include:   Runny or stuffy (congested) nose.   Sneezing.   Cough.   Sore throat.   Headache.   Fatigue.   Fever.   Loss of appetite.   Pain in your forehead, behind your eyes, and over your cheekbones (sinus pain).  Muscle aches.  DIAGNOSIS  Your health care provider may diagnose a URI by:  Physical exam.  Tests to check that your symptoms are not due to  another condition such as:  Strep throat.  Sinusitis.  Pneumonia.  Asthma. TREATMENT  A URI goes away on its own with time. It cannot be cured with medicines, but medicines may be prescribed or recommended to relieve symptoms. Medicines may help:  Reduce your fever.  Reduce your cough.  Relieve nasal congestion. HOME CARE INSTRUCTIONS   Take medicines only as directed by your health care provider.   Gargle warm saltwater or take cough drops to comfort your throat as directed by your health care provider.  Use a warm mist humidifier or inhale steam from a shower to increase air moisture. This may make it easier to breathe.  Drink enough fluid to keep your urine clear or pale yellow.   Eat soups and other clear broths and maintain good nutrition.   Rest as needed.   Return to work when your temperature has returned to normal or as your health care provider advises. You may need to stay home longer to avoid infecting others. You can also use a face mask and careful hand washing to prevent spread of the virus.  Increase the usage of your inhaler if you have asthma.   Do not use any tobacco products, including cigarettes, chewing tobacco, or electronic cigarettes. If you need help quitting, ask your health care provider. PREVENTION  The best way to protect yourself from getting a cold is to practice good hygiene.   Avoid oral or hand contact with people with cold   symptoms.   Wash your hands often if contact occurs.  There is no clear evidence that vitamin C, vitamin E, echinacea, or exercise reduces the chance of developing a cold. However, it is always recommended to get plenty of rest, exercise, and practice good nutrition.  SEEK MEDICAL CARE IF:   You are getting worse rather than better.   Your symptoms are not controlled by medicine.   You have chills.  You have worsening shortness of breath.  You have brown or red mucus.  You have yellow or brown nasal  discharge.  You have pain in your face, especially when you bend forward.  You have a fever.  You have swollen neck glands.  You have pain while swallowing.  You have white areas in the back of your throat. SEEK IMMEDIATE MEDICAL CARE IF:   You have severe or persistent:  Headache.  Ear pain.  Sinus pain.  Chest pain.  You have chronic lung disease and any of the following:  Wheezing.  Prolonged cough.  Coughing up blood.  A change in your usual mucus.  You have a stiff neck.  You have changes in your:  Vision.  Hearing.  Thinking.  Mood. MAKE SURE YOU:   Understand these instructions.  Will watch your condition.  Will get help right away if you are not doing well or get worse.   This information is not intended to replace advice given to you by your health care provider. Make sure you discuss any questions you have with your health care provider.   Document Released: 01/26/2001 Document Revised: 12/17/2014 Document Reviewed: 11/07/2013 Elsevier Interactive Patient Education 2016 [employer]  

## 2015-09-30 NOTE — Progress Notes (Signed)
Subjective:    Patient ID: Jo Pittman, female    DOB: [birth year], 25 y.o.   MRN: [id number]  HPI Jo Pittman, a 25 year old female with type I diabetes, chronic back pain, depression, sickle cell anemia, and vitamin D deficiency presents complaining of upper respiratory symptoms. She states that she was recently evaluated at [facility] 6 days ago and started on an antibiotic for chest congestion, cough, nasal congestion, and fever. She states that symptoms have improved but she continues to have cough. She denies fever, chest pains, shortness of breath,dyspnea on exertion, wheezing, or chest tightness. Patient also has a history of asthma that has been controlled. She was diagnosed with asthma as a child. She maintains that most exacerbations occur during season changes. Symptoms are controlled on symbicort twice daily.  Medications used in the past to treat these symptoms include beta agonist inhalers and inhaled steroids.   Patient has a history  of Type 1 diabetes mellitus.  The initial diagnosis of diabetes was made as a teenager.  Her clinical course has been stable since being on the insulin pump. Associated symptoms of hyperglycemia have been blurry vision and polyuria.  She currently denies symptoms of hypoglycemia. Patient was under the care of an endocrinologist while living in [place]. She maintains that compliance with glucose monitoring has been good. She states that meal planning has improved since initial visit. She has an appointment scheduled with Dr. Leslie Dales, endocrinology.   Past Medical History  Diagnosis Date  . Diabetes mellitus without complication (HCC)     type I  . Back pain, chronic   . Sickle cell anemia (HCC)     HX of, no longer  . Stroke [facility]) 2008    R sided weakness/aneurism   Past Surgical History  Procedure Laterality Date  . Bone marrow transplant    . Cholecystectomy    . Appendectomy     Social History   Social History  . Marital  Status: Single    Spouse Name: N/A  . Number of Children: N/A  . Years of Education: N/A   Occupational History  . Not on file.   Social History Main Topics  . Smoking status: Former Games developer  . Smokeless tobacco: Not on file  . Alcohol Use: 2.4 oz/week    4 Shots of liquor per week     Comment: occ  . Drug Use: Yes    Special: Marijuana  . Sexual Activity: Not on file   Other Topics Concern  . Not on file   Social History Narrative     Review of Systems  Constitutional: Negative.  Negative for fever.  HENT: Positive for congestion and postnasal drip.   Eyes: Negative.   Respiratory: Positive for cough. Negative for apnea, chest tightness, shortness of breath, wheezing and stridor.   Cardiovascular: Negative.  Negative for palpitations and leg swelling.  Gastrointestinal: Negative.  Negative for blood in stool and abdominal distention.  Endocrine: Negative for polydipsia, polyphagia and polyuria.  Genitourinary: Negative for dysuria, decreased urine volume and vaginal bleeding.  Musculoskeletal: Positive for myalgias and back pain.  Skin: Negative.   Allergic/Immunologic: Negative.  Negative for immunocompromised state.  Neurological: Negative.  Negative for dizziness and light-headedness.  Hematological: Negative.   Psychiatric/Behavioral: Negative.       Objective:   Physical Exam  Constitutional: She is oriented to person, place, and time. She appears well-developed and well-nourished.  HENT:  Head: Normocephalic and atraumatic.  Right Ear: External ear  normal.  Left Ear: External ear normal.  Nose: Nose normal.  Mouth/Throat: Oropharynx is clear and moist.  Eyes: Conjunctivae and EOM are normal. Pupils are equal, round, and reactive to light.  Neck: Normal range of motion. Neck supple.  Cardiovascular: Normal rate, regular rhythm, normal heart sounds and intact distal pulses.   Pulmonary/Chest: Effort normal. She has rhonchi.  Congested   Abdominal: Soft.  Bowel sounds are normal.  Musculoskeletal:       Lumbar back: She exhibits decreased range of motion. She exhibits no tenderness.  Neurological: She is alert and oriented to person, place, and time. She has normal reflexes.  Skin: Skin is warm and dry.  Psychiatric: She has a normal mood and affect. Her behavior is normal. Judgment and thought content normal.     BP 117/84 mmHg  Pulse 78  Temp(Src) 98.3 F (36.8 C)  Resp 16  Ht  (1.676 m)  Wt 107 lb (48.535 kg)  BMI 17.28 kg/m2  LMP 09/26/2015 Assessment & Plan:  1. Cough, persistent Patient is continuing course of Levaquin and prednisone as previously prescribed for an upper respiratory infection. Will continue Hycodan every 8 hours as needed for persistent cough. Patient to improve water intake rest and handwashing. Recommend humidifier or breathing in warm steam from shower to moisturize nasal cavity.  - HYDROcodone-homatropine (HYCODAN) 5-1.5 MG/5ML syrup; Take 5 mLs by mouth every 8 (eight) hours as needed for cough.  Dispense: 120 mL; Refill: 0  2. Symptoms of upper respiratory infection (URI) Refer to #1  3. Asthma, mild intermittent, uncomplicated Continue Symbicort as previously prescribed. Use rescue inhaler for persistent cough, wheezing, or chest tightness  4. Type 1 diabetes mellitus without complication (HCC) Continue carbohydrate counting along with carb modified diet. Follow up with Dr. Casimiro Needle Altheimer as scheduled.   RTC: 1 month for a follow up of chronic conditions or as needed  Massie Maroon, FNP

## 2015-10-03 ENCOUNTER — Ambulatory Visit: Payer: Medicare Other | Admitting: Family Medicine

## 2015-10-10 ENCOUNTER — Ambulatory Visit: Payer: Medicare Other | Admitting: Family Medicine

## 2015-10-10 ENCOUNTER — Ambulatory Visit (HOSPITAL_COMMUNITY): Payer: Medicare Other | Admitting: Psychiatry

## 2015-10-29 ENCOUNTER — Ambulatory Visit (HOSPITAL_COMMUNITY): Payer: Medicare Other | Admitting: Psychiatry

## 2015-11-13 ENCOUNTER — Encounter (HOSPITAL_COMMUNITY): Payer: Self-pay | Admitting: Psychiatry

## 2015-11-13 ENCOUNTER — Ambulatory Visit (INDEPENDENT_AMBULATORY_CARE_PROVIDER_SITE_OTHER): Payer: Medicare Other | Admitting: Psychiatry

## 2015-11-13 VITALS — BP 106/73 | HR 109 | Ht 65.25 in | Wt 98.8 lb

## 2015-11-13 DIAGNOSIS — F1510 Other stimulant abuse, uncomplicated: Secondary | ICD-10-CM | POA: Insufficient documentation

## 2015-11-13 DIAGNOSIS — E109 Type 1 diabetes mellitus without complications: Secondary | ICD-10-CM | POA: Diagnosis not present

## 2015-11-13 DIAGNOSIS — F339 Major depressive disorder, recurrent, unspecified: Secondary | ICD-10-CM | POA: Diagnosis not present

## 2015-11-13 DIAGNOSIS — F411 Generalized anxiety disorder: Secondary | ICD-10-CM

## 2015-11-13 MED ORDER — MIRTAZAPINE 15 MG PO TABS: 15.0000 mg | ORAL_TABLET | Freq: Every day | ORAL | Status: DC

## 2015-11-13 NOTE — Progress Notes (Signed)
Psychiatric Initial Adult Assessment   Patient Identification: Jo Pittman MRN:  [id number] Date of Evaluation:  11/13/2015 Referral Source: Referred by her PCP Dr. Hart Rochester Chief Complaint:   anxiety and depression Visit Diagnosis:    ICD-9-CM ICD-10-CM   1. Anxiety state 300.00 F41.1   2. Type 1 diabetes mellitus without complication (HCC) 250.01 E10.9   3. Major depression, recurrent, chronic (HCC) 296.30 F33.9   4. Generalized anxiety disorder 300.02 F41.1   5. Caffeine abuse, continuous 305.91 F15.10     History of Present Illness:  24 year old African-American single female referred to my by her PCP Dr. Hart Rochester for psychiatric assessment. 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  Patient states that she moved from [place] in December 2016. Patient carries a previous diagnosis of depression and anxiety and had been treated in the past with Wellbutrin Lexapro and Xanax. Her PCP would not prescribe her Xanax for her anxiety and referred her to me.  Patient states that she is presently living with her sister and sister's partner and her partner in one apartment. A and has multiple health problems which includes diabetes type 1, sickle cell trait for which she has had a bone marrow transplant, chronic back pain degenerative disc disease asthma and has a tendency to get sick , whenever the weather changes or if she is stressed out.  Patient is trying to get into pain management clinic and is on the wait list. Patient states that she ran out of her antidepressant and the Xanax when she moved here and has been off of them since December. She states that her pain affects her moods and anxiety significantly. She ruminates constantly about the financial situation.  Patient is bisexual and her last menstrual period was on 11/04/2015. She is currently with a female partner  Associated Signs/Symptoms: Depression Symptoms:   depressed mood, anhedonia, insomnia, psychomotor retardation, fatigue, feelings of worthlessness/guilt, anxiety, disturbed sleep, decreased appetite, (Hypo) Manic Symptoms:  None Anxiety Symptoms:  Excessive Worry, Psychotic Symptoms:  None PTSD Symptoms: NA  Past Psychiatric History: Patient has seen a psychiatrist prior to her bone marrow transplant and was diagnosed with depression. Has been treated with Wellbutrin, Lexapro and Xanax for anxiety.  Previous Psychotropic Medications: Yes   Substance Abuse History in the last 12 months:  Yes.   patient quit smoking cigarettes a year ago used to smoke 4-5 cigarettes per month. #2 uses alcohol 4-6 mixed beverages over the weekend. Uses marijuana half a blunt with males to increase her appetite.  Patient drinks 4of 24 ounces of diet soda a. Day  Consequences of Substance Abuse: NA  Past Medical History:  Past Medical History  Diagnosis Date  . Diabetes mellitus without complication (HCC)     type I  . Back pain, chronic   . Sickle cell anemia (HCC)     HX of, no longer  . Stroke [facility]) 2008    R sided weakness/aneurism    Past Surgical History  Procedure Laterality Date  . Bone marrow transplant    . Cholecystectomy    . Appendectomy      Family Psychiatric History: Dad and paternal uncle are crack addicts  Family History:  Family History  Problem Relation Age of Onset  . Cancer Paternal Aunt   . Cancer Paternal Grandmother     Social History:  Patient lives with her sister and their respective partners in [place] Social History  Social History  . Marital Status: Single    Spouse Name: N/A  . Number of Children: N/A  . Years of Education: N/A   Social History Main Topics  . Smoking status: Former Smoker    Types: Cigarettes    Quit date: 05/16/2015  . Smokeless tobacco: None  . Alcohol Use: 2.4 oz/week    4 Shots of liquor per week     Comment: occ  . Drug Use: Yes    Special: Marijuana  .  Sexual Activity: Not Asked   Other Topics Concern  . None   Social History Narrative      Allergies:   Allergies  Allergen Reactions  . Carafate [Sucralfate]   . Morphine And Related     Itching when given IV   . Vancomycin     Metabolic Disorder Labs: Lab Results  Component Value Date   HGBA1C 7.9* 09/09/2015   MPG 180* 09/09/2015   No results found for: PROLACTIN No results found for: CHOL, TRIG, HDL, CHOLHDL, VLDL, LDLCALC   Current Medications: Current Outpatient Prescriptions  Medication Sig Dispense Refill  . acetaminophen (TYLENOL) 500 MG tablet Take 1 tablet (500 mg total) by mouth every 6 (six) hours as needed. 30 tablet 0  . albuterol (PROVENTIL HFA;VENTOLIN HFA) 108 (90 Base) MCG/ACT inhaler Inhale into the lungs every 6 (six) hours as needed for wheezing or shortness of breath.    . budesonide-formoterol (SYMBICORT) 80-4.5 MCG/ACT inhaler Inhale 2 puffs into the lungs 2 (two) times daily.    . fluticasone (FLONASE) 50 MCG/ACT nasal spray     . HYDROcodone-homatropine (HYCODAN) 5-1.5 MG/5ML syrup Take 5 mLs by mouth every 8 (eight) hours as needed for cough. 120 mL 0  . ibuprofen (ADVIL,MOTRIN) 800 MG tablet Take 1 tablet (800 mg total) by mouth 3 (three) times daily. 21 tablet 0  . insulin regular (NOVOLIN R,HUMULIN R) 100 units/mL injection Inject into the skin 3 (three) times daily before meals.    Marland Kitchen. levofloxacin (LEVAQUIN) 500 MG tablet     . meclizine (ANTIVERT) 25 MG tablet Take 25 mg by mouth 3 (three) times daily as needed for dizziness.    . methocarbamol (ROBAXIN) 500 MG tablet Take 1 tablet (500 mg total) by mouth 2 (two) times daily. 60 tablet 1  . mirtazapine (REMERON) 15 MG tablet Take 1 tablet (15 mg total) by mouth at bedtime. 30 tablet 1  . montelukast (SINGULAIR) 10 MG tablet Take 1 tablet (10 mg total) by mouth at bedtime. 30 tablet 3  . ondansetron (ZOFRAN-ODT) 8 MG disintegrating tablet Take 8 mg by mouth every 8 (eight) hours as needed for  nausea or vomiting.    Marland Kitchen. oxyCODONE (OXYCONTIN) 15 mg 12 hr tablet Take 15 mg by mouth every 12 (twelve) hours. Reported on 09/30/2015    . predniSONE (STERAPRED UNI-PAK 21 TAB) 5 MG (21) TBPK tablet     . Vitamin D, Ergocalciferol, (DRISDOL) 50000 units CAPS capsule Take 50,000 Units by mouth every 7 (seven) days.     No current facility-administered medications for this visit.    Neurologic: Headache: No Seizure: No Paresthesias:No  Musculoskeletal: Strength & Muscle Tone: within normal limits Gait & Station: normal Patient leans: Stand straight  Psychiatric Specialty Exam: ROS  Blood pressure 106/73, pulse 109, height 5' 5.25" (1.657 m), weight 98 lb 12.8 oz (44.815 kg).Body mass index is 16.32 kg/(m^2).  General Appearance: Casual  Eye Contact:  Good  Speech:  Clear and Coherent and Normal Rate  Volume:  Decreased  Mood:  Anxious, Depressed and Dysphoric  Affect:  Constricted and Tearful  Thought Process:  Goal Directed, Linear and Logical  Orientation:  Full (Time, Place, and Person)  Thought Content:  Rumination  Suicidal Thoughts:  No  Homicidal Thoughts:  No  Memory:  Immediate;   Good Recent;   Good Remote;   Good  Judgement:  Fair  Insight:  Fair  Psychomotor Activity:  Normal  Concentration:  Good  Recall:  Good  Fund of Knowledge:Good  Language: Good  Akathisia:  No  Handed:  Right  AIMS (if indicated):    Assets:  Communication Skills Desire for Improvement Housing Social Support  ADL's:  Intact  Cognition: WNL  Sleep:  Poor     Treatment Plan Summary: Medication management #1 major depression recurrent chronic Discussed rationale risks benefits options of Remeron 15 mg by mouth daily at bedtime and patient gave informed consent she'll start this tonight. #2 generalized anxiety disorder Will be treated with Remeron.  #3 caffeine abuse Discussed decreasing her soda intake. Is here for bottles discussed that she drink 2 bottles per day and her  last caffeine intake no later than 3 PM she stated understanding. Sleep hygiene was also discussed in great detail.   #4 labs Were recently done by her endocrinologist. #5 therapy Patient is being referred to [facility] for therapy #6 patient will return to see me in the clinic in 3 weeks or call sooner if necessary.  Discussed with the patient that I would be leaving the clinic and that I would be transferring her care to Dr. Lolly Mustache a and will schedule an appointment for him to see her in 8 weeks patient is comfortable with this. This was an initial visit of 60 minutes. More than 50% of the time was spent in counseling and care coordination. Interpersonal and supportive therapy was discussed sleep hygiene was discussed in great detail.  Margit Banda, MD 3/30/201711:40 AM

## 2015-12-04 ENCOUNTER — Ambulatory Visit (HOSPITAL_COMMUNITY): Payer: Self-pay | Admitting: Psychiatry

## 2015-12-08 ENCOUNTER — Emergency Department (HOSPITAL_COMMUNITY): Payer: Medicare Other

## 2015-12-08 ENCOUNTER — Emergency Department (HOSPITAL_COMMUNITY)
Admission: EM | Admit: 2015-12-08 | Discharge: 2015-12-08 | Disposition: A | Discharge status: Home / Self Care | Payer: Medicare Other | Attending: Emergency Medicine | Admitting: Emergency Medicine

## 2015-12-08 ENCOUNTER — Encounter (HOSPITAL_COMMUNITY): Payer: Self-pay

## 2015-12-08 ENCOUNTER — Ambulatory Visit (HOSPITAL_COMMUNITY): Payer: Self-pay | Admitting: Clinical

## 2015-12-08 DIAGNOSIS — R531 Weakness: Secondary | ICD-10-CM | POA: Insufficient documentation

## 2015-12-08 DIAGNOSIS — Z87891 Personal history of nicotine dependence: Secondary | ICD-10-CM | POA: Diagnosis not present

## 2015-12-08 DIAGNOSIS — M6289 Other specified disorders of muscle: Secondary | ICD-10-CM | POA: Diagnosis not present

## 2015-12-08 DIAGNOSIS — E119 Type 2 diabetes mellitus without complications: Secondary | ICD-10-CM | POA: Insufficient documentation

## 2015-12-08 DIAGNOSIS — G43909 Migraine, unspecified, not intractable, without status migrainosus: Secondary | ICD-10-CM | POA: Diagnosis not present

## 2015-12-08 DIAGNOSIS — R42 Dizziness and giddiness: Secondary | ICD-10-CM | POA: Diagnosis not present

## 2015-12-08 DIAGNOSIS — Z79899 Other long term (current) drug therapy: Secondary | ICD-10-CM | POA: Insufficient documentation

## 2015-12-08 DIAGNOSIS — I639 Cerebral infarction, unspecified: Secondary | ICD-10-CM

## 2015-12-08 DIAGNOSIS — Z794 Long term (current) use of insulin: Secondary | ICD-10-CM | POA: Diagnosis not present

## 2015-12-08 DIAGNOSIS — R51 Headache: Secondary | ICD-10-CM

## 2015-12-08 DIAGNOSIS — G8929 Other chronic pain: Secondary | ICD-10-CM | POA: Insufficient documentation

## 2015-12-08 DIAGNOSIS — Z862 Personal history of diseases of the blood and blood-forming organs and certain disorders involving the immune mechanism: Secondary | ICD-10-CM | POA: Diagnosis not present

## 2015-12-08 DIAGNOSIS — Z8673 Personal history of transient ischemic attack (TIA), and cerebral infarction without residual deficits: Secondary | ICD-10-CM | POA: Insufficient documentation

## 2015-12-08 DIAGNOSIS — Z3202 Encounter for pregnancy test, result negative: Secondary | ICD-10-CM | POA: Diagnosis not present

## 2015-12-08 DIAGNOSIS — R200 Anesthesia of skin: Secondary | ICD-10-CM | POA: Diagnosis present

## 2015-12-08 DIAGNOSIS — G43009 Migraine without aura, not intractable, without status migrainosus: Secondary | ICD-10-CM

## 2015-12-08 LAB — COMPREHENSIVE METABOLIC PANEL
ALT: 15 U/L (ref 14–54)
AST: 23 U/L (ref 15–41)
Albumin: 5.2 g/dL — ABNORMAL HIGH (ref 3.5–5.0)
Alkaline Phosphatase: 95 U/L (ref 38–126)
Anion gap: 14 (ref 5–15)
BUN: 10 mg/dL (ref 6–20)
CHLORIDE: 107 mmol/L (ref 101–111)
CO2: 19 mmol/L — AB (ref 22–32)
CREATININE: 0.72 mg/dL (ref 0.44–1.00)
Calcium: 10.3 mg/dL (ref 8.9–10.3)
GFR calc non Af Amer: >60 mL/min (ref >60)
Glucose, Bld: 114 mg/dL — ABNORMAL HIGH (ref 65–99)
Potassium: 4 mmol/L (ref 3.5–5.1)
SODIUM: 140 mmol/L (ref 135–145)
Total Bilirubin: 0.8 mg/dL (ref 0.3–1.2)
Total Protein: 8.3 g/dL — ABNORMAL HIGH (ref 6.5–8.1)

## 2015-12-08 LAB — DIFFERENTIAL
BASOS ABS: 0 10*3/uL (ref 0.0–0.1)
BASOS PCT: 0 %
Eosinophils Absolute: 0 10*3/uL (ref 0.0–0.7)
Eosinophils Relative: 0 %
Lymphocytes Relative: 22 %
Lymphs Abs: 2.5 10*3/uL (ref 0.7–4.0)
MONO ABS: 1.2 10*3/uL — AB (ref 0.1–1.0)
Monocytes Relative: 11 %
NEUTROS ABS: 7.7 10*3/uL (ref 1.7–7.7)
NEUTROS PCT: 67 %

## 2015-12-08 LAB — I-STAT CHEM 8, ED
BUN: 12 mg/dL (ref 6–20)
CALCIUM ION: 1.16 mmol/L (ref 1.12–1.23)
CHLORIDE: 105 mmol/L (ref 101–111)
CREATININE: 0.6 mg/dL (ref 0.44–1.00)
GLUCOSE: 111 mg/dL — AB (ref 65–99)
HCT: 45 % (ref 36.0–46.0)
HEMOGLOBIN: 15.3 g/dL — AB (ref 12.0–15.0)
POTASSIUM: 4 mmol/L (ref 3.5–5.1)
SODIUM: 140 mmol/L (ref 135–145)
TCO2: 24 mmol/L (ref 0–100)

## 2015-12-08 LAB — CBC
HCT: 40 % (ref 36.0–46.0)
Hemoglobin: 13.8 g/dL (ref 12.0–15.0)
MCH: 33.7 pg (ref 26.0–34.0)
MCHC: 34.5 g/dL (ref 30.0–36.0)
MCV: 97.6 fL (ref 78.0–100.0)
PLATELETS: 194 10*3/uL (ref 150–400)
RBC: 4.1 MIL/uL (ref 3.87–5.11)
RDW: 12.4 % (ref 11.5–15.5)
WBC: 11.4 10*3/uL — AB (ref 4.0–10.5)

## 2015-12-08 LAB — I-STAT BETA HCG BLOOD, ED (MC, WL, AP ONLY): I-stat hCG, quantitative: <5 m[IU]/mL (ref <5)

## 2015-12-08 LAB — PROTIME-INR
INR: 1.13 (ref 0.00–1.49)
PROTHROMBIN TIME: 14.7 s (ref 11.6–15.2)

## 2015-12-08 LAB — I-STAT TROPONIN, ED: Troponin i, poc: 0 ng/mL (ref 0.00–0.08)

## 2015-12-08 LAB — APTT: APTT: 27 s (ref 24–37)

## 2015-12-08 MED ORDER — DIPHENHYDRAMINE HCL 50 MG/ML IJ SOLN
25.0000 mg | Freq: Once | INTRAMUSCULAR | Status: AC
  Administered 2015-12-08: 25 mg via INTRAVENOUS
  Filled 2015-12-08: qty 1

## 2015-12-08 MED ORDER — KETOROLAC TROMETHAMINE 30 MG/ML IJ SOLN
30.0000 mg | Freq: Once | INTRAMUSCULAR | Status: AC
  Administered 2015-12-08: 30 mg via INTRAVENOUS
  Filled 2015-12-08: qty 1

## 2015-12-08 MED ORDER — METOCLOPRAMIDE HCL 5 MG/ML IJ SOLN
10.0000 mg | Freq: Once | INTRAMUSCULAR | Status: AC
  Administered 2015-12-08: 10 mg via INTRAVENOUS
  Filled 2015-12-08: qty 2

## 2015-12-08 NOTE — Discharge Instructions (Signed)
General Headache Without Cause There is no evidence of stroke. Follow up with the neurologist. Return to the ED if you develop new or worsening symptoms. A headache is pain or discomfort felt around the head or neck area. The specific cause of a headache may not be found. There are many causes and types of headaches. A few common ones are:  Tension headaches.  Migraine headaches.  Cluster headaches.  Chronic daily headaches. HOME CARE INSTRUCTIONS  Watch your condition for any changes. Take these steps to help with your condition: Managing Pain  Take over-the-counter and prescription medicines only as told by your health care provider.  Lie down in a dark, quiet room when you have a headache.  If directed, apply ice to the head and neck area:  Put ice in a plastic bag.  Place a towel between your skin and the bag.  Leave the ice on for 20 minutes, 2-3 times per day.  Use a heating pad or hot shower to apply heat to the head and neck area as told by your health care provider.  Keep lights dim if bright lights bother you or make your headaches worse. Eating and Drinking  Eat meals on a regular schedule.  Limit alcohol use.  Decrease the amount of caffeine you drink, or stop drinking caffeine. General Instructions  Keep all follow-up visits as told by your health care provider. This is important.  Keep a headache journal to help find out what may trigger your headaches. For example, write down:  What you eat and drink.  How much sleep you get.  Any change to your diet or medicines.  Try massage or other relaxation techniques.  Limit stress.  Sit up straight, and do not tense your muscles.  Do not use tobacco products, including cigarettes, chewing tobacco, or e-cigarettes. If you need help quitting, ask your health care provider.  Exercise regularly as told by your health care provider.  Sleep on a regular schedule. Get 7-9 hours of sleep, or the amount  recommended by your health care provider. SEEK MEDICAL CARE IF:   Your symptoms are not helped by medicine.  You have a headache that is different from the usual headache.  You have nausea or you vomit.  You have a fever. SEEK IMMEDIATE MEDICAL CARE IF:   Your headache becomes severe.  You have repeated vomiting.  You have a stiff neck.  You have a loss of vision.  You have problems with speech.  You have pain in the eye or ear.  You have muscular weakness or loss of muscle control.  You lose your balance or have trouble walking.  You feel faint or pass out.  You have confusion.   This information is not intended to replace advice given to you by your health care provider. Make sure you discuss any questions you have with your health care provider.   Document Released: 08/02/2005 Document Revised: 04/23/2015 Document Reviewed: 11/25/2014 Elsevier Interactive Patient Education [employer]2016 [employer] Inc.

## 2015-12-08 NOTE — ED Notes (Signed)
RN accompanied pt. To MRI at this time.

## 2015-12-08 NOTE — ED Provider Notes (Signed)
CSN: [id number]     Arrival date & time 12/08/15  1557 History   First MD Initiated Contact with Patient 12/08/15 1559     Chief Complaint  Patient presents with  . Code Stroke     (Consider location/radiation/quality/duration/timing/severity/associated sxs/prior Treatment) HPI Comments: Patient is type I diabetic, history of sickle cell anemia status post bone marrow transplant in 2010, previous stroke in 2008 with right-sided weakness presenting with episode of sudden onset dizziness, double vision, near syncope and left-sided weakness.  1 PM while she was trying to cook a meal. She suddenly felt generally weak, had blurry vision and dizziness and had a lower herself to the ground. She did not fall or hit her head. She did not lose consciousness though she is uncertain. Her girlfriend states that she did not pass out. She denies any chest pain, back pain or abdominal pain. She complains of a headache currently. States her previous stroke left her with some right-sided weakness but no symptoms on the left.  Code stroke was activated on initial assessment.  The history is provided by the patient and the EMS personnel. The history is limited by the condition of the patient.    Past Medical History  Diagnosis Date  . Diabetes mellitus without complication (HCC)     type I  . Back pain, chronic   . Sickle cell anemia (HCC)     HX of, no longer  . Stroke [facility](HCC) 2008    R sided weakness/aneurism   Past Surgical History  Procedure Laterality Date  . Bone marrow transplant    . Cholecystectomy    . Appendectomy     Family History  Problem Relation Age of Onset  . Cancer Paternal Aunt   . Cancer Paternal Grandmother    Social History  Substance Use Topics  . Smoking status: Former Smoker    Types: Cigarettes    Quit date: 05/16/2015  . Smokeless tobacco: None  . Alcohol Use: 2.4 oz/week    4 Shots of liquor per week     Comment: occ   OB History    No data available      Review of Systems  Constitutional: Negative for fever, activity change and appetite change.  HENT: Negative for congestion.   Respiratory: Negative for cough, chest tightness and shortness of breath.   Cardiovascular: Negative for chest pain and leg swelling.  Gastrointestinal: Negative for nausea, vomiting and abdominal pain.  Genitourinary: Negative for dysuria, hematuria, vaginal bleeding and vaginal discharge.  Musculoskeletal: Negative for myalgias and arthralgias.  Skin: Negative for rash.  Neurological: Positive for dizziness, weakness, numbness and headaches. Negative for speech difficulty.  A complete 10 system review of systems was obtained and all systems are negative except as noted in the HPI and PMH.      Allergies  Vancomycin; Carafate; and Morphine and related  Home Medications   Prior to Admission medications   Medication Sig Start Date End Date Taking? Authorizing Provider  acetaminophen (TYLENOL) 500 MG tablet Take 1 tablet (500 mg total) by mouth every 6 (six) hours as needed. 09/01/15  Yes Mady GemmaElizabeth C Westfall, PA-C  albuterol (PROVENTIL HFA;VENTOLIN HFA) 108 (90 Base) MCG/ACT inhaler Inhale into the lungs every 6 (six) hours as needed for wheezing or shortness of breath.   Yes Historical Provider, MD  budesonide-formoterol (SYMBICORT) 80-4.5 MCG/ACT inhaler Inhale 2 puffs into the lungs 2 (two) times daily.   Yes Historical Provider, MD  ibuprofen (ADVIL,MOTRIN) 800 MG tablet Take 1  tablet (800 mg total) by mouth 3 (three) times daily. Patient taking differently: Take 800 mg by mouth every 8 (eight) hours as needed for moderate pain.  09/01/15  Yes Mady Gemma, PA-C  Insulin Human (INSULIN PUMP) SOLN Inject 1 each into the skin continuous. "Novolog"   Yes Historical Provider, MD  meclizine (ANTIVERT) 25 MG tablet Take 25 mg by mouth 3 (three) times daily as needed for dizziness.   Yes Historical Provider, MD  methocarbamol (ROBAXIN) 500 MG tablet Take 1  tablet (500 mg total) by mouth 2 (two) times daily. Patient taking differently: Take 500 mg by mouth every 8 (eight) hours as needed for muscle spasms.  09/09/15  Yes Massie Maroon, FNP  mirtazapine (REMERON) 15 MG tablet Take 1 tablet (15 mg total) by mouth at bedtime. 11/13/15 11/12/16 Yes Gayland Curry, MD  montelukast (SINGULAIR) 10 MG tablet Take 1 tablet (10 mg total) by mouth at bedtime. 09/10/15  Yes Massie Maroon, FNP  Vitamin D, Ergocalciferol, (DRISDOL) 50000 units CAPS capsule Take 50,000 Units by mouth every Wednesday.    Yes Historical Provider, MD  HYDROcodone-homatropine (HYCODAN) 5-1.5 MG/5ML syrup Take 5 mLs by mouth every 8 (eight) hours as needed for cough. 09/30/15   Massie Maroon, FNP   BP 107/68 mmHg  Pulse 81  Temp(Src) 99.1 F (37.3 C) (Oral)  Resp 20  Ht  (1.676 m)  Wt 101 lb 6.6 oz (46 kg)  BMI 16.38 kg/m2  SpO2 98%  LMP  Physical Exam  Constitutional: She is oriented to person, place, and time. She appears well-developed and well-nourished. No distress.  anxious  HENT:  Head: Normocephalic and atraumatic.  Mouth/Throat: Oropharynx is clear and moist. No oropharyngeal exudate.  Eyes: Conjunctivae and EOM are normal. Pupils are equal, round, and reactive to light.  Neck: Normal range of motion. Neck supple.  No meningismus.  Cardiovascular: Normal rate, regular rhythm, normal heart sounds and intact distal pulses.   No murmur heard. Pulmonary/Chest: Effort normal and breath sounds normal. No respiratory distress.  Abdominal: Soft. There is no tenderness. There is no rebound and no guarding.  Musculoskeletal: Normal range of motion. She exhibits no edema or tenderness.  Neurological: She is alert and oriented to person, place, and time. A cranial nerve deficit is present. She exhibits normal muscle tone. Coordination normal.  Left-sided facial droop with asymmetric smile. Tongue deviates to right. 3/5 strength of left upper extremity and left  lower extremity with decreased grip strength. Pronator drift on left. Some difficulty with extraocular movements and difficulty looking to the left with her left eye.  Skin: Skin is warm.  Psychiatric: She has a normal mood and affect. Her behavior is normal.  Nursing note and vitals reviewed.   ED Course  Procedures (including critical care time) Labs Review Labs Reviewed  CBC - Abnormal; Notable for the following:    WBC 11.4 (*)    All other components within normal limits  DIFFERENTIAL - Abnormal; Notable for the following:    Monocytes Absolute 1.2 (*)    All other components within normal limits  COMPREHENSIVE METABOLIC PANEL - Abnormal; Notable for the following:    CO2 19 (*)    Glucose, Bld 114 (*)    Total Protein 8.3 (*)    Albumin 5.2 (*)    All other components within normal limits  I-STAT CHEM 8, ED - Abnormal; Notable for the following:    Glucose, Bld 111 (*)  Hemoglobin 15.3 (*)    All other components within normal limits  PROTIME-INR  APTT  I-STAT TROPOININ, ED  I-STAT BETA HCG BLOOD, ED ([facility], [facility], [facility] ONLY)    Imaging Review Ct Head Wo Contrast  12/08/2015  CLINICAL DATA:  Code stroke. Left-sided weakness beginning 3 hours ago or less. EXAM: CT HEAD WITHOUT CONTRAST TECHNIQUE: Contiguous axial images were obtained from the base of the skull through the vertex without intravenous contrast. COMPARISON:  None. FINDINGS: The brain has a normal appearance without evidence of malformation, atrophy, old or acute infarction, mass lesion, hemorrhage, hydrocephalus or extra-axial collection. The calvarium is unremarkable. The paranasal sinuses, middle ears and mastoids are clear. These results were called by telephone at the time of interpretation on 12/08/2015 at 4:11 pm to Dr. Dr. Amada Jupiter, who verbally acknowledged these results. Aspects score 10. IMPRESSION: Normal head CT Electronically Signed   By: Paulina Fusi M.D.   On: 12/08/2015 16:13   Mr Brain Wo  Contrast  12/08/2015  CLINICAL DATA:  25 year old female with sickle cell disease treated with bone marrow transplant presenting with left-sided weakness. Subsequent encounter. EXAM: MRI HEAD WITHOUT CONTRAST TECHNIQUE: Multiplanar, multiecho pulse sequences of the brain and surrounding structures were obtained without intravenous contrast. COMPARISON:  12/08/2015 head CT.  No comparison brain MR. FINDINGS: No acute infarct or intracranial hemorrhage. Right vertebral artery is not visualized. Remainder of major intracranial vascular structures are patent. Diffuse bone marrow abnormality consistent with patient's history of sickle cell disease. No hydrocephalus. No intracranial mass lesion noted on this unenhanced exam. Minimal exophthalmos. Cervical medullary junction unremarkable. Pituitary region appears small on sagittal imaging but of normal size on coronal imaging. IMPRESSION: No acute infarct. Diffuse bone marrow changes consistent with patient's history of sickle cell disease. Right vertebral artery not visualized. Images reviewed with Dr. Amada Jupiter 12/08/2015 4:56 p.m. Electronically Signed   By: Lacy Duverney M.D.   On: 12/08/2015 17:26   I have personally reviewed and evaluated these images and lab results as part of my medical decision-making.   EKG Interpretation   Date/Time:  Monday December 08 2015 17:42:06 EDT Ventricular Rate:  79 PR Interval:  143 QRS Duration: 86 QT Interval:  371 QTC Calculation: 425 R Axis:   75 Text Interpretation:  Sinus rhythm Baseline wander in lead(s) V3 No  previous ECGs available Confirmed by Manus Gunning  MD, Rebeckah Masih [phone]) on  12/08/2015 5:50:01 PM      MDM   Final diagnoses:  Nonintractable migraine, unspecified migraine type   Acute onset of blurry vision, headache, left-sided weakness and facial droop last seen normal 1 PM. Code stroke activated on initial assessment. EKG nsr.   CT head is negative. Patient seen by Dr. Amada Jupiter of neurology.  he feels that patient's symptoms are inconsistent with neurological etiology and recommends MRI.  MRI is negative for acute stroke or other pathology. Patient is ambulatory. Her left-sided weakness has resolved. She states her vision is back to baseline. Headache has resolved. Neurology suspect complicated migraine. She appears stable for discharge.  Tolerating by mouth and ambulatory. Her neurological deficits have resolved. Suspect complex migraine versus psychogenic presentation. Return precautions discussed.  Glynn Octave, MD 12/09/15 [phone]

## 2015-12-08 NOTE — ED Notes (Signed)
Pt. Drinking water with no issues.

## 2015-12-08 NOTE — ED Notes (Addendum)
Pt. Ambulated at this time. Pt. Had no issues and required no assistance.

## 2015-12-08 NOTE — ED Notes (Signed)
Code stroke called @ 1603. Spoke w/ Maisie Fushomas.

## 2015-12-08 NOTE — ED Notes (Signed)
Pt. Returned from MRI at this time.  

## 2015-12-08 NOTE — Consult Note (Signed)
Requesting Physician: Dr. Manus Gunning    Chief Complaint: code stroke  History obtained from:  Patient   HPI:                                                                                                                                         Myrta Mercer is an 25 y.o. female with known history of sickle cell disease but after bone transplant has sickle cell trait. Patient had a stroke win the past 2008 causing right sided weakness. Today while she was trying to cook a meal. She suddenly felt generally weak, had blurry vision and dizziness and had a lower herself to the ground. She did not fall or hit her head. She did not lose consciousness though she is uncertain. Her girlfriend states that she did not pass out.  Currently she is showing left arm and leg weakness and stating her left arm/leg and face have decreased sensation. Some findings inconsistent and will obtain STAT MRI.   Date last known well: 12/08/2015 Time last known well: Time: 13:45 tPA Given: No:      Past Medical History  Diagnosis Date  . Diabetes mellitus without complication (HCC)     type I  . Back pain, chronic   . Sickle cell anemia (HCC)     HX of, no longer  . Stroke [facility]) 2008    R sided weakness/aneurism    Past Surgical History  Procedure Laterality Date  . Bone marrow transplant    . Cholecystectomy    . Appendectomy      Family History  Problem Relation Age of Onset  . Cancer Paternal Aunt   . Cancer Paternal Grandmother    Social History:  reports that she quit smoking about 6 months ago. Her smoking use included Cigarettes. She does not have any smokeless tobacco history on file. She reports that she drinks about 2.4 oz of alcohol per week. She reports that she uses illicit drugs (Marijuana).  Allergies:  Allergies  Allergen Reactions  . Carafate [Sucralfate]   . Morphine And Related     Itching when given IV   . Vancomycin     Medications:                                                                                                                           No current facility-administered  medications for this encounter.   Current Outpatient Prescriptions  Medication Sig Dispense Refill  . acetaminophen (TYLENOL) 500 MG tablet Take 1 tablet (500 mg total) by mouth every 6 (six) hours as needed. 30 tablet 0  . albuterol (PROVENTIL HFA;VENTOLIN HFA) 108 (90 Base) MCG/ACT inhaler Inhale into the lungs every 6 (six) hours as needed for wheezing or shortness of breath.    . budesonide-formoterol (SYMBICORT) 80-4.5 MCG/ACT inhaler Inhale 2 puffs into the lungs 2 (two) times daily.    . fluticasone (FLONASE) 50 MCG/ACT nasal spray     . HYDROcodone-homatropine (HYCODAN) 5-1.5 MG/5ML syrup Take 5 mLs by mouth every 8 (eight) hours as needed for cough. 120 mL 0  . ibuprofen (ADVIL,MOTRIN) 800 MG tablet Take 1 tablet (800 mg total) by mouth 3 (three) times daily. 21 tablet 0  . insulin regular (NOVOLIN R,HUMULIN R) 100 units/mL injection Inject into the skin 3 (three) times daily before meals.    Marland Kitchen levofloxacin (LEVAQUIN) 500 MG tablet     . meclizine (ANTIVERT) 25 MG tablet Take 25 mg by mouth 3 (three) times daily as needed for dizziness.    . methocarbamol (ROBAXIN) 500 MG tablet Take 1 tablet (500 mg total) by mouth 2 (two) times daily. 60 tablet 1  . mirtazapine (REMERON) 15 MG tablet Take 1 tablet (15 mg total) by mouth at bedtime. 30 tablet 1  . montelukast (SINGULAIR) 10 MG tablet Take 1 tablet (10 mg total) by mouth at bedtime. 30 tablet 3  . ondansetron (ZOFRAN-ODT) 8 MG disintegrating tablet Take 8 mg by mouth every 8 (eight) hours as needed for nausea or vomiting.    Marland Kitchen oxyCODONE (OXYCONTIN) 15 mg 12 hr tablet Take 15 mg by mouth every 12 (twelve) hours. Reported on 09/30/2015    . predniSONE (STERAPRED UNI-PAK 21 TAB) 5 MG (21) TBPK tablet     . Vitamin D, Ergocalciferol, (DRISDOL) 50000 units CAPS capsule Take 50,000 Units by mouth every 7 (seven) days.       ROS:                                                                                                                                        History obtained from the patient  General ROS: negative for - chills, fatigue, fever, night sweats, weight gain or weight loss Psychological ROS: negative for - behavioral disorder, hallucinations, memory difficulties, mood swings or suicidal ideation Ophthalmic ROS: POSITIVE for - blurry vision, double vision, ENT ROS: negative for - epistaxis, nasal discharge, oral lesions, sore throat, tinnitus or vertigo Allergy and Immunology ROS: negative for - hives or itchy/watery eyes Hematological and Lymphatic ROS: negative for - bleeding problems, bruising or swollen lymph nodes Endocrine ROS: negative for - galactorrhea, hair pattern changes, polydipsia/polyuria or temperature intolerance Respiratory ROS: negative for - cough, hemoptysis, shortness of breath or wheezing Cardiovascular ROS: negative for - chest pain, dyspnea  on exertion, edema or irregular heartbeat Gastrointestinal ROS: negative for - abdominal pain, diarrhea, hematemesis, nausea/vomiting or stool incontinence Genito-Urinary ROS: negative for - dysuria, hematuria, incontinence or urinary frequency/urgency Musculoskeletal ROS: negative for - joint swelling or muscular weakness Neurological ROS: as noted in HPI Dermatological ROS: negative for rash and skin lesion changes  Neurologic Examination:                                                                                                      Blood pressure 108/88, pulse 100, temperature 99.1 F (37.3 C), temperature source Oral, resp. rate 14, height  (1.676 m), weight 46 kg (101 lb 6.6 oz), SpO2 99 %.  HEENT-  Normocephalic, no lesions, without obvious abnormality.  Normal external eye and conjunctiva.  Normal TM's bilaterally.  Normal auditory canals and external ears. Normal external nose, mucus membranes and septum.  Normal  pharynx. Cardiovascular- S1, S2 normal, pulses palpable throughout   Lungs- chest clear, no wheezing, rales, normal symmetric air entry, Heart exam - S1, S2 normal, no murmur, no gallop, rate regular Abdomen- normal findings: bowel sounds normal Extremities- no edema Lymph-no adenopathy palpable Musculoskeletal-no joint tenderness, deformity or swelling Skin-warm and dry, no hyperpigmentation, vitiligo, or suspicious lesions  Neurological Examination Mental Status: Alert, oriented, thought content appropriate.  Speech fluent without evidence of aphasia.  Able to follow 3 step commands without difficulty. Cranial Nerves: II:  Visual fields grossly normal in the right eye but is seeing double with her left eye when her right eye is covered, pupils equal, round, reactive to light and accommodation III,IV, VI: ptosis not present, she has what appears to be an inconsistent convergence spasm on EOM(liklely psychogenic finding) V,VII: smile symmetric, facial light touch sensation decreased in th e left but does not split midline with tuning fork VIII: hearing normal bilaterally IX,X: uvula rises symmetrically XI: bilateral shoulder shrug XII: midline tongue extension Motor: Right : Upper extremity   5/5    Left:     Upper extremity   5/5  Lower extremity   3/5     Lower extremity   3/5 --of note, she shows poor effort in the left arm and leg.  Tone and bulk:normal tone throughout; no atrophy noted Sensory: Pinprick and light touch decreased in the left arm and leg Deep Tendon Reflexes: 2+ and symmetric throughout Plantars: Right: downgoing   Left: downgoing Cerebellar: normal finger-to-nose,  and normal heel-to-shin test Gait: not tested       Lab Results: Basic Metabolic Panel: No results for input(s): NA, K, CL, CO2, GLUCOSE, BUN, CREATININE, CALCIUM, MG, PHOS in the last 168 hours.  Liver Function Tests: No results for input(s): AST, ALT, ALKPHOS, BILITOT, PROT, ALBUMIN in the  last 168 hours. No results for input(s): LIPASE, AMYLASE in the last 168 hours. No results for input(s): AMMONIA in the last 168 hours.  CBC: No results for input(s): WBC, NEUTROABS, HGB, HCT, MCV, PLT in the last 168 hours.  Cardiac Enzymes: No results for input(s): CKTOTAL, CKMB, CKMBINDEX, TROPONINI in the last 168 hours.  Lipid  Panel: No results for input(s): CHOL, TRIG, HDL, CHOLHDL, VLDL, LDLCALC in the last 168 hours.  CBG: No results for input(s): GLUCAP in the last 168 hours.  Microbiology: No results found for this or any previous visit.  Coagulation Studies: No results for input(s): LABPROT, INR in the last 72 hours.  Imaging: Ct Head Wo Contrast  12/08/2015  CLINICAL DATA:  Code stroke. Left-sided weakness beginning 3 hours ago or less. EXAM: CT HEAD WITHOUT CONTRAST TECHNIQUE: Contiguous axial images were obtained from the base of the skull through the vertex without intravenous contrast. COMPARISON:  None. FINDINGS: The brain has a normal appearance without evidence of malformation, atrophy, old or acute infarction, mass lesion, hemorrhage, hydrocephalus or extra-axial collection. The calvarium is unremarkable. The paranasal sinuses, middle ears and mastoids are clear. These results were called by telephone at the time of interpretation on 12/08/2015 at 4:11 pm to Dr. Dr. Amada JupiterKirkpatrick, who verbally acknowledged these results. Aspects score 10. IMPRESSION: Normal head CT Electronically Signed   By: Paulina FusiMark  Shogry M.D.   On: 12/08/2015 16:13       Assessment and plan discussed with with attending physician and they are in agreement.    Felicie MornDavid Smith PA-C [facility] [phone]  12/08/2015, 4:28 PM   Assessment: 25 y.o. female presenting to the ED due to pre-syncopal event followed by left arm and leg weakness and decreased sensation. Initial CT head showed no acute bleed or infarct. Patient on exam shows decreased sensation in the left arm and leg along with  inconsistent weakness of same extremities.   Strongly suspect psychogenic contribution to this presentation. It is difficult to rule out complex a migraine given the patient's complaint of headache with photophobia.  Stroke Risk Factors - diabetes mellitus  1) Stat MRI(availabel at the time of this dictation and is negative) 2) Treat for complicated migraine  3) No further workup if MRI is negative.   Ritta SlotMcNeill Kirkpatrick, MD [facility] [phone]  If 7pm- 7am, please page neurology on call as listed in AMION.

## 2015-12-08 NOTE — Code Documentation (Signed)
25yo [facility] [id number].  Patient was at home cooking with her girlfriend when she had sudden onset difficulty breathing, called out to her girlfriend and then became unresponsive.  Patient with h/o sickle cell disease with subsequent transplant and stroke with right sided deficits.  Patient presented to the ED and c/o LUE tingling and double vision and code stroke was activated.  Patient to CT.  Stroke team to the bedside.  NIHSS 2, see documentation for details and code stroke times.  Patient with inconsistent exam, however, unable to state month and reports subjective decreased sensation in the left face, arm and leg. Dr. Amada JupiterKirkpatrick at the bedside.  Patient to MRI at 1634 with ED RN and Stroke RN.  Patient removed insulin pump for MRI - pump given to ED RN.  Dr. Amada JupiterKirkpatrick to MRI - [facility] imaging negative for acute infarct per MD.  Code stroke canceled.  Patient to complete MRI per MD then return to the ED.  Handoff with ED RN Denny PeonErin.

## 2015-12-08 NOTE — ED Notes (Signed)
Stroke team at bedside

## 2015-12-08 NOTE — ED Notes (Signed)
Pt. Coming from home via [facility] for episode of unresponsiveness at home. Pt. Last seen normal by significant other at 1345. Pt. Became unresponsive and SO called EMS. Upon arrival EMS states pt. Was still unresponsive and was holding her breath. En route pt. Became responsive and only c/o tingling to left hand and double vision. Pt. Hx of CVA 2008, sickle cell, insulin dependent diabetes, and anxiety. Pt. Recently moved here from [place] and hasn't gotten all her healthcare moved over. Pt. Residual weakness on right side from old CVA. Pt. Only alert to person and situation, not time.

## 2015-12-29 ENCOUNTER — Ambulatory Visit (HOSPITAL_COMMUNITY): Payer: Self-pay | Admitting: Psychiatry

## 2015-12-29 ENCOUNTER — Ambulatory Visit: Payer: Medicare Other | Admitting: Family Medicine

## 2016-01-29 ENCOUNTER — Ambulatory Visit (HOSPITAL_COMMUNITY): Payer: Self-pay | Admitting: Psychiatry

## 2016-02-04 ENCOUNTER — Other Ambulatory Visit: Payer: Self-pay | Admitting: Family Medicine

## 2016-02-04 ENCOUNTER — Encounter: Payer: Medicare Other | Admitting: Family Medicine

## 2016-02-04 VITALS — BP 105/78 | HR 90 | Temp 98.1°F | Resp 14 | Ht 66.0 in | Wt 98.0 lb

## 2016-02-04 LAB — GLUCOSE, CAPILLARY: Glucose-Capillary: 265 mg/dL — ABNORMAL HIGH (ref 65–99)

## 2016-02-04 NOTE — Progress Notes (Signed)
  This encounter was created in error - please disregard. Patient decided that she does not want to been seen.

## 2016-02-16 ENCOUNTER — Ambulatory Visit (INDEPENDENT_AMBULATORY_CARE_PROVIDER_SITE_OTHER): Payer: Medicare Other | Admitting: Psychiatry

## 2016-02-16 ENCOUNTER — Encounter (HOSPITAL_COMMUNITY): Payer: Self-pay | Admitting: Psychiatry

## 2016-02-16 VITALS — BP 101/70 | HR 92 | Ht 66.0 in | Wt 98.4 lb

## 2016-02-16 DIAGNOSIS — F331 Major depressive disorder, recurrent, moderate: Secondary | ICD-10-CM

## 2016-02-16 MED ORDER — DULOXETINE HCL 20 MG PO CPEP: ORAL_CAPSULE | ORAL | Status: DC

## 2016-02-16 NOTE — Progress Notes (Signed)
Psychiatric Initial Adult Assessment   Patient Identification: Jo Pittman MRN:  [id number] Date of service:  02/16/2016   Chief Complaint:   I tried Remeron for 30 days but did not help me.  Making more depressed and I was very sleepy and groggy.   History of Present Illness:   Jo Pittman is 25 year old African-American, single, currently unemployed female who was seen 3 months ago by Dr. Jarold Motto for the [facility] of depression and anxiety symptoms.  Patient was prescribed Remeron however she stopped taking her for 30 days because she did not see any improvement.  She felt more depressed anxious and having grogginess and excessive sedation.  Patient moved from [place] in December 2016 to cut she does not want to live there anymore.  She wanted a new fresh life and she was not happy with the culture, surrounding and neighborhood.  Patient moved with her partner to live with her sister and her partner.  Patient has lot of health issues including diabetes , sickle cell, chronic pain and asthma.  She has a history of bone marrow transplant and she remember after that she started having depression and anxiety symptoms.  She had tried Lexapro, Wellbutrin and recently Remeron and she remember all these medication cause more depression and fatigue.  She admitted to having crying spells and sometime feeling hopeless helpless and racing thoughts.  She sleeps on and off.  She mentioned her depression and anxiety is related to her chronic pain.  The days when she is in a lot of pain her depression get worse.  Patient denies any mania, psychosis, hallucination or any paranoia.  She used to take lorazepam and Xanax by her primary care physician which has been discontinued since December 2016.  Patient admitted smoking marijuana on and off to help the appetite.  She also drinks alcohol 4-6 mixed beverages over the weekend but denies any intoxication, blackouts, tremors, shakes or any withdrawal symptoms.  She  ruminates about her stressors which are chronic and debilitating .  She admitted her energy level is low.  There are times when she does not leave her house because of chronic pain.  Patient denies any self abusive behavior, aggression or any OCD or PTSD symptoms.  Her appetite is fair.  Her energy level is okay.  Her vital signs are stable.  Past Psychiatric History: Patient has seen a psychiatrist during her bone marrow transplant and she was diagnosed with depression.  She was given Wellbutrin, Lexapro, and recently Remeron.  Patient denies any history of suicidal attempt, inpatient psychiatric treatment, mania, psychosis or any hallucination.  Substance Abuse History in the last 12 months:  Yes.   patient quit smoking cigarettes a year ago used to smoke 4-5 cigarettes per month. #2 uses alcohol 4-6 mixed beverages over the weekend. Uses marijuana half a blunt with males to increase her appetite.  Past Medical History: Her primary care physician is Jo Pittman.  She also see [facility] Past Medical History  Diagnosis Date  . Diabetes mellitus without complication (HCC)     type I  . Back pain, chronic   . Sickle cell anemia (HCC)     HX of, no longer  . Stroke [facility]) 2008    R sided weakness/aneurism    Past Surgical History  Procedure Laterality Date  . Bone marrow transplant    . Cholecystectomy    . Appendectomy      Family Psychiatric History:  Dad and paternal uncle are crack addicts  Social History:   Patient born and raised in [place].  She admitted having a chaotic childhood.  Her father was never there.  She endorse her father was in and out from jail because of using drugs .  Her mother lives in [place] .  Patient is still have a few cousins live there.  She moved to [place] in December 2016 to live close to her sister.  Patient lives with her sister and their respective partners in [place].  She is hoping to move out soon to have her own  place.  Patient and her partner are in a good relationship for past 5 years. Social History   Social History  . Marital Status: Single    Spouse Name: N/A  . Number of Children: N/A  . Years of Education: N/A   Social History Main Topics  . Smoking status: Former Smoker    Types: Cigarettes    Quit date: 05/16/2015  . Smokeless tobacco: None  . Alcohol Use: 2.4 oz/week    4 Shots of liquor per week     Comment: occ  . Drug Use: Yes    Special: Marijuana  . Sexual Activity: Not Asked   Other Topics Concern  . None   Social History Narrative      Allergies:   Allergies  Allergen Reactions  . Vancomycin Itching and Rash    redman syndrome  . Carafate [Sucralfate] Itching  . Morphine And Related Itching    Itching when given IV     Metabolic Disorder Labs: Lab Results  Component Value Date   HGBA1C 7.9* 09/09/2015   MPG 180* 09/09/2015   No results found for: PROLACTIN No results found for: CHOL, TRIG, HDL, CHOLHDL, VLDL, LDLCALC   Current Medications: Current Outpatient Prescriptions  Medication Sig Dispense Refill  . albuterol (PROVENTIL HFA;VENTOLIN HFA) 108 (90 Base) MCG/ACT inhaler Inhale into the lungs every 6 (six) hours as needed for wheezing or shortness of breath.    . budesonide-formoterol (SYMBICORT) 80-4.5 MCG/ACT inhaler Inhale 2 puffs into the lungs 2 (two) times daily.    . DULoxetine (CYMBALTA) 20 MG capsule Take 1 capsule for 1 week and than 2 daily 60 capsule 1  . Insulin Human (INSULIN PUMP) SOLN Inject 1 each into the skin continuous. "Novolog"    . oxycodone (OXY-IR) 5 MG capsule Take 5 mg by mouth every 4 (four) hours as needed.    . Vitamin D, Ergocalciferol, (DRISDOL) 50000 units CAPS capsule Take 50,000 Units by mouth every Wednesday.      No current facility-administered medications for this visit.    Neurologic: Headache: No Seizure: No Paresthesias:No  Musculoskeletal: Strength & Muscle Tone: within normal limits Gait &  Station: normal Patient leans: Stand straight  Psychiatric Specialty Exam: Review of Systems  Constitutional: Positive for malaise/fatigue.  Eyes: Negative for blurred vision.  Cardiovascular: Negative for chest pain.  Musculoskeletal: Positive for joint pain.  Neurological: Positive for headaches. Negative for dizziness.  Psychiatric/Behavioral: Positive for depression. The patient is nervous/anxious and has insomnia.     Blood pressure 101/70, pulse 92, height 5\' 6"  (1.676 m), weight 98 lb 6.4 oz (44.634 kg), last menstrual period 01/13/2016.Body mass index is 15.89 kg/(m^2).  General Appearance: Casual  Eye Contact:  Good  Speech:  Clear and Coherent and Normal Rate  Volume:  Decreased  Mood:  Anxious, Depressed and Dysphoric  Affect:  Constricted and Tearful  Thought Process:  Goal Directed, Linear and Logical  Orientation:  Full (Time, Place, and Person)  Thought Content:  Rumination  Suicidal Thoughts:  No  Homicidal Thoughts:  No  Memory:  Immediate;   Good Recent;   Good Remote;   Good  Judgement:  Fair  Insight:  Fair  Psychomotor Activity:  Normal  Concentration:  Good  Recall:  Good  Fund of Knowledge:Good  Language: Good  Akathisia:  No  Handed:  Right  AIMS (if indicated):    Assets:  Communication Skills Desire for Improvement Housing Social Support  ADL's:  Intact  Cognition: WNL  Sleep:  Poor     Assessment ; Axis I ; Major depressive disorder, recurrent moderate.  Anxiety disorder NOS.    Axis II ; deferred   Treatment Plan Summary: Medication management I review history, symptoms, current medication, recent blood work and collateral information from other provider.  She recently visited emergency room and saw a neurologist for weakness.  She had extensive workup negative for cardiology.  She is no longer taking any muscle relaxant.  She is taking oxycodone for pain and vitamin D.  She also had insulin pump and her last hemoglobin A1c was 7.9  which was done in January 2017.  I discussed psychosocial stressors.  In the past she had tried Lexapro, Wellbutrin and recently Remeron with limited response.  Recommended to try Cymbalta 20 mg daily for 1 week and then 40 mg daily to help the pain anxiety and depression.  Discuss side effects and benefits including helping chronic pain.  Patient preferred the medicine that helps the pain and anxiety and depression.  Also suggested to see a therapist for coping and social skills.  She was referred last time but she could not make the appointment due to busy schedule.  Recommended to call us back if she has any question, concern if she feels worsening of the symptom.  Discuss safety plan that anytime having active suicidal thoughts or homicidal thoughts and she need to call 911 or go to the local emergency room.  Follow-up in 2 months.   Travarus Trudo T., MD 7/3/201711:08 AM

## 2016-03-03 ENCOUNTER — Emergency Department (HOSPITAL_BASED_OUTPATIENT_CLINIC_OR_DEPARTMENT_OTHER)
Admission: EM | Admit: 2016-03-03 | Discharge: 2016-03-03 | Disposition: A | Discharge status: Home / Self Care | Payer: Medicare Other | Attending: Emergency Medicine | Admitting: Emergency Medicine

## 2016-03-03 ENCOUNTER — Encounter: Payer: Self-pay | Admitting: Gastroenterology

## 2016-03-03 ENCOUNTER — Telehealth (HOSPITAL_BASED_OUTPATIENT_CLINIC_OR_DEPARTMENT_OTHER): Payer: Self-pay | Admitting: *Deleted

## 2016-03-03 DIAGNOSIS — R197 Diarrhea, unspecified: Secondary | ICD-10-CM | POA: Insufficient documentation

## 2016-03-03 DIAGNOSIS — E1165 Type 2 diabetes mellitus with hyperglycemia: Secondary | ICD-10-CM | POA: Insufficient documentation

## 2016-03-03 DIAGNOSIS — R112 Nausea with vomiting, unspecified: Secondary | ICD-10-CM | POA: Diagnosis present

## 2016-03-03 DIAGNOSIS — Z87891 Personal history of nicotine dependence: Secondary | ICD-10-CM | POA: Diagnosis not present

## 2016-03-03 DIAGNOSIS — R739 Hyperglycemia, unspecified: Secondary | ICD-10-CM

## 2016-03-03 DIAGNOSIS — R1110 Vomiting, unspecified: Secondary | ICD-10-CM

## 2016-03-03 LAB — CBG MONITORING, ED: GLUCOSE-CAPILLARY: 245 mg/dL — AB (ref 65–99)

## 2016-03-03 MED ORDER — PROMETHAZINE HCL 25 MG PO TABS: 25.0000 mg | ORAL_TABLET | Freq: Four times a day (QID) | ORAL | Status: DC | PRN

## 2016-03-03 MED FILL — PROMETHAZINE 25 MG TABLET: 25 | 3 days supply | Qty: 12 | Fill #0

## 2016-03-03 NOTE — ED Notes (Signed)
Lab work drawn with IV start, awaiting processing. cbg [id number].

## 2016-03-03 NOTE — ED Notes (Signed)
MD at bedside. 

## 2016-03-03 NOTE — ED Provider Notes (Signed)
Anything working CSN: [id number]     Arrival date & time 03/03/16  0520 History   First MD Initiated Contact with Patient 03/03/16 [phone]     No chief complaint on file.    (Consider location/radiation/quality/duration/timing/severity/associated sxs/prior Treatment) The history is provided by the patient.  Old records on her not available due to computers being down during the night. Patient 25 year old female reports weeks to months of nausea vomiting and weight loss. Followed by endocrinology in [place]. Patient has known diabetes. Patient also has a history of bone marrow treatment for sickle cell disease also reports history of stroke at a young age. Patient reports radiation treatment. Difficult to put all this together. Patient states blood sugars been running in the 250-300 range now for weeks. Endocrinologist is not helping primary care doctors not helping. Patient is taking Zofran at home. Patient states she's vomiting of 4 times a day approximately. Also associated with some loose bowel movements.  No past medical history on file. No past surgical history on file. No family history on file. Social History  Substance Use Topics  . Smoking status: Not on file  . Smokeless tobacco: Not on file  . Alcohol Use: Not on file   OB History    No data available     Review of Systems  Constitutional: Positive for appetite change. Negative for fever.  HENT: Negative for congestion.   Eyes: Negative for visual disturbance.  Respiratory: Negative for shortness of breath.   Cardiovascular: Negative for chest pain.  Gastrointestinal: Positive for nausea, vomiting and diarrhea. Negative for abdominal pain.  Genitourinary: Negative for dysuria.  Musculoskeletal: Negative for back pain.  Skin: Negative for rash.  Neurological: Negative for headaches.  Hematological: Does not bruise/bleed easily.  Psychiatric/Behavioral: Negative for confusion.      Allergies  Review of patient's  allergies indicates not on file.  Home Medications   Prior to Admission medications   Medication Sig Start Date End Date Taking? Authorizing Provider  promethazine (PHENERGAN) 25 MG tablet Take 1 tablet (25 mg total) by mouth every 6 (six) hours as needed for nausea or vomiting. 03/03/16   Vanetta Mulders, MD   BP 104/73 mmHg  Pulse 81  Resp 18  SpO2 100% Physical Exam  Constitutional: She is oriented to person, place, and time. She appears well-developed and well-nourished. No distress.  HENT:  Head: Normocephalic and atraumatic.  Mouth/Throat: Oropharynx is clear and moist.  Eyes: Conjunctivae and EOM are normal. Pupils are equal, round, and reactive to light.  Neck: Normal range of motion. Neck supple.  Cardiovascular: Normal rate and regular rhythm.   No murmur heard. Pulmonary/Chest: Effort normal and breath sounds normal.  Abdominal: Soft. Bowel sounds are normal. There is no tenderness.  Musculoskeletal: Normal range of motion.  Neurological: She is alert and oriented to person, place, and time. No cranial nerve deficit. She exhibits normal muscle tone. Coordination normal.  Skin: Skin is warm.  Nursing note and vitals reviewed.   ED Course  Procedures (including critical care time) Labs Review Labs Reviewed - No data to display  Imaging Review No results found. I have personally reviewed and evaluated these images and lab results as part of my medical decision-making.   EKG Interpretation None      MDM   Final diagnoses:  Hyperglycemia  Intractable vomiting with nausea, vomiting of unspecified type    Patient treated here with IV fluids. Patient received 1 L. Patient's labs although not crossing over without any significant  abnormalities. Sodium is 135, potassium is 4.0. Chloride is 103. CO2 2 is 23, blood sugar was 247. BUN was 12 creatinine 0.58. Liver function tests without abnormalities. White blood cell count is 9000 hemoglobin 13.2. Platelets 198.  Patient clinically appears a little bit dry. Patient without any, dictating factors from the elevated blood sugar. Patient will be given referral to GI medicine for consideration for gastric studies as well as follow back up with her endocrinologist. Patient states endocrinologist not helping her with her problem. Patient also be started on Phenergan. Patient is nontoxic no acute distress appearing    Vanetta MuldersScott Benno Brensinger, MD 03/03/16 [phone]

## 2016-03-03 NOTE — ED Notes (Signed)
See paper chart for TRIAGE notes.

## 2016-03-03 NOTE — Telephone Encounter (Signed)
Orders placed after downtime.

## 2016-03-03 NOTE — ED Notes (Signed)
NS Bolus completed. Saline Locked.

## 2016-03-03 NOTE — Discharge Instructions (Signed)
Blood sugars elbow elevated here today without any complicating factors. Recommend continuing to take your Zofran and out on the Phenergan to see if that helps control the nausea and vomiting. Also would recommend following back up with your endocrinologist as well as you been given a referral to GI medicine for consideration for gastric emptying studies.

## 2016-03-09 ENCOUNTER — Ambulatory Visit (HOSPITAL_COMMUNITY): Payer: Self-pay | Admitting: Clinical

## 2016-03-12 ENCOUNTER — Ambulatory Visit (INDEPENDENT_AMBULATORY_CARE_PROVIDER_SITE_OTHER): Payer: Medicare Other | Admitting: Family Medicine

## 2016-03-12 ENCOUNTER — Encounter: Payer: Self-pay | Admitting: Family Medicine

## 2016-03-12 VITALS — BP 98/62 | HR 87 | Temp 98.6°F | Ht 66.0 in | Wt 95.1 lb

## 2016-03-12 DIAGNOSIS — R110 Nausea: Secondary | ICD-10-CM | POA: Diagnosis not present

## 2016-03-12 DIAGNOSIS — K219 Gastro-esophageal reflux disease without esophagitis: Secondary | ICD-10-CM

## 2016-03-12 DIAGNOSIS — R634 Abnormal weight loss: Secondary | ICD-10-CM | POA: Diagnosis not present

## 2016-03-12 DIAGNOSIS — R112 Nausea with vomiting, unspecified: Secondary | ICD-10-CM

## 2016-03-12 DIAGNOSIS — I639 Cerebral infarction, unspecified: Secondary | ICD-10-CM | POA: Diagnosis not present

## 2016-03-12 LAB — CBC WITH DIFFERENTIAL/PLATELET
BASOS PCT: 0 %
Basophils Absolute: 0 10*3/uL (ref 0.0–0.1)
EOS ABS: 0.1 10*3/uL (ref 0.0–0.7)
Eosinophils Relative: 1 %
HEMATOCRIT: 36.5 % (ref 36.0–46.0)
Hemoglobin: 13.2 g/dL (ref 12.0–15.0)
Lymphocytes Relative: 36 %
Lymphs Abs: 3.6 10*3/uL (ref 0.7–4.0)
MCH: 34.9 pg — ABNORMAL HIGH (ref 26.0–34.0)
MCHC: 36.2 g/dL — AB (ref 30.0–36.0)
MCV: 96.6 fL (ref 78.0–100.0)
MONO ABS: 1.2 10*3/uL — AB (ref 0.1–1.0)
MONOS PCT: 12 %
Neutro Abs: 5.1 10*3/uL (ref 1.7–7.7)
Neutrophils Relative %: 51 %
Platelets: 198 10*3/uL (ref 150–400)
RBC: 3.78 MIL/uL — ABNORMAL LOW (ref 3.87–5.11)
RDW: 11.4 % — AB (ref 11.5–15.5)
WBC: 10 10*3/uL (ref 4.0–10.5)

## 2016-03-12 LAB — COMPREHENSIVE METABOLIC PANEL
ALBUMIN: 5 g/dL (ref 3.5–5.0)
ALT: 30 U/L (ref 14–54)
ANION GAP: 9 (ref 5–15)
AST: 30 U/L (ref 15–41)
Alkaline Phosphatase: 99 U/L (ref 38–126)
BILIRUBIN TOTAL: 0.7 mg/dL (ref 0.3–1.2)
BUN: 12 mg/dL (ref 6–20)
CO2: 23 mmol/L (ref 22–32)
Calcium: 9.7 mg/dL (ref 8.9–10.3)
Chloride: 103 mmol/L (ref 101–111)
Creatinine, Ser: 0.58 mg/dL (ref 0.44–1.00)
GFR calc Af Amer: >60 mL/min (ref >60)
GFR calc non Af Amer: >60 mL/min (ref >60)
GLUCOSE: 247 mg/dL — AB (ref 65–99)
POTASSIUM: 4 mmol/L (ref 3.5–5.1)
SODIUM: 135 mmol/L (ref 135–145)
TOTAL PROTEIN: 8 g/dL (ref 6.5–8.1)

## 2016-03-12 MED ORDER — PROMETHAZINE HCL 25 MG PO TABS: 12.5000 mg | ORAL_TABLET | Freq: Two times a day (BID) | ORAL | 1 refills | Status: DC | PRN

## 2016-03-12 MED ORDER — OMEPRAZOLE 40 MG PO CPDR: 40.0000 mg | DELAYED_RELEASE_CAPSULE | Freq: Every day | ORAL | 2 refills | Status: DC

## 2016-03-12 NOTE — Progress Notes (Signed)
HPI:   [name] is a 25 y.o. female, who is here today to establish care with me.  Former PCP: Dr Jo Pittman. Last preventive routine visit: 2016 with gyn.  Concerns today: wt loss and vomiting.  She is very concerned about weight loss, according to patient she always been underweight, mainly since she had bone marrow transplant in 2010 but states that usually she was able to keep her weight up if she increased food intake.  Hx of sickle cell disease, in 2010 she underwent marrow transplant 2 times, after the second one she received chemotherapy and radiation therapy. History of CVA at age 31, no residual deficit.  She is not longer following with sickle cell clinic, according to patient she was released.  She attributes her weight loss due to persistent nausea and vomiting.  Nausea and vomiting for 8-9 months worse for the past 4 months. In average 4 episodes of non-bilious and nonprojectile vomiting, even without food intake.  She was evaluated in the ER on 03/03/2016, she was discharged home with a Rx for Promethazine 25 mg, which helped greatly with nausea and vomiting. Since medication was started she has noted 1.5 pounds weight gain. She denies any side effect and she is tolerating medication well. She mentions that she is also on Zofran but this medication does not help. She already has an appt with GI in 04/2016.  She has Hx of chronic pain, she has been on opoid medication for pain management "all my life", Methadone, Morphine, and lately Oxycodone. Pain medication initially started because sickle cell disease, she also has history of chronic cervical and lumbar back with radiculopathy of left lower extremity. Since 03/2015 she has been following with pain management.  Prior history of GERD, + Heartburn and retrosternal chest pain associated with certain food. She was on PPI in the past, she has not been taking any for the past few months.  She also has  history of anxiety and some depression, currently she is following with psychiatrist. Early July/2017 she was started on Cymbalta 20 mg, currently she is taking 2 capsules. She reports "a little" diarrhea but he seems to be improving.  She denies any tremor or history of thyroid disease. According to patient her thyroid function is routinely checked by her endocrinologist. History of DM 1 (Dx 2011), she last saw her endocrinologist 02/19/2016, A1c 8.1, her BS's are doing better with recent adjustment of her insulin dose and her next follow-up appointment 03/18/2016.  She also has history of asthma, currently she is on Symbicort and albuterol. She lives with her sister and her partner (female).  She is reporting a screening for HIV, negative.   Lab Results  Component Value Date   WBC 11.4 (H) 12/08/2015   HGB 15.3 (H) 12/08/2015   HCT 45.0 12/08/2015   MCV 97.6 12/08/2015   PLT 194 12/08/2015    Lab Results  Component Value Date   HGBA1C 7.9 (H) 09/09/2015     Review of Systems  Constitutional: Positive for fatigue (No more than usual) and unexpected weight change. Negative for activity change, appetite change, diaphoresis and fever.  HENT: Negative for mouth sores, nosebleeds, sore throat, trouble swallowing and voice change.   Eyes: Negative for redness and visual disturbance.  Respiratory: Negative for cough, shortness of breath and wheezing.   Cardiovascular: Positive for chest pain (Occasionally, not exertional). Negative for palpitations and leg swelling.  Gastrointestinal: Positive for diarrhea (Improved), nausea and vomiting. Negative for  abdominal distention, abdominal pain and blood in stool.  Endocrine: Negative for cold intolerance, heat intolerance and polyphagia.  Genitourinary: Negative for decreased urine volume, difficulty urinating, dysuria and hematuria.  Musculoskeletal: Positive for arthralgias, back pain and neck pain. Negative for myalgias.  Skin: Negative  for color change and rash.  Neurological: Negative for tremors, seizures, syncope, weakness, numbness and headaches.  Hematological: Negative for adenopathy. Does not bruise/bleed easily.  Psychiatric/Behavioral: Positive for sleep disturbance. Negative for confusion and suicidal ideas. The patient is nervous/anxious.       Current Outpatient Prescriptions on File Prior to Visit  Medication Sig Dispense Refill  . albuterol (PROVENTIL HFA;VENTOLIN HFA) 108 (90 Base) MCG/ACT inhaler Inhale into the lungs every 6 (six) hours as needed for wheezing or shortness of breath.    . budesonide-formoterol (SYMBICORT) 80-4.5 MCG/ACT inhaler Inhale 2 puffs into the lungs 2 (two) times daily.    . DULoxetine (CYMBALTA) 20 MG capsule Take 1 capsule for 1 week and than 2 daily 60 capsule 1  . Insulin Human (INSULIN PUMP) SOLN Inject 1 each into the skin continuous. "Novolog"    . oxycodone (OXY-IR) 5 MG capsule Take 5 mg by mouth every 4 (four) hours as needed.    . Vitamin D, Ergocalciferol, (DRISDOL) 50000 units CAPS capsule Take 50,000 Units by mouth every Wednesday.      No current facility-administered medications on file prior to visit.      Past Medical History:  Diagnosis Date  . Back pain, chronic   . Diabetes mellitus without complication (HCC)    type I  . Sickle cell anemia (HCC)    HX of, no longer  . Stroke [facility]) 2008   R sided weakness/aneurism   Allergies  Allergen Reactions  . Vancomycin Itching and Rash    redman syndrome  . Carafate [Sucralfate] Itching  . Morphine And Related Itching    Itching when given IV     Family History  Problem Relation Age of Onset  . Cancer Paternal Aunt   . Cancer Paternal Grandmother   . Stroke Paternal Grandmother     Social History   Social History  . Marital status: Single    Spouse name: N/A  . Number of children: N/A  . Years of education: N/A   Social History Main Topics  . Smoking status: Former Smoker    Types:  Cigarettes    Quit date: 05/16/2015  . Smokeless tobacco: None  . Alcohol use 2.4 oz/week    4 Shots of liquor per week     Comment: occ  . Drug use:     Types: Marijuana  . Sexual activity: Not Asked   Other Topics Concern  . None   Social History Narrative  . None    Vitals:   03/12/16 0658  BP: 98/62  Pulse: 87  Temp: 98.6 F (37 C)    Body mass index is 15.35 kg/m.  Wt Readings from Last 3 Encounters:  03/12/16 95 lb 2 oz (43.1 kg)  02/04/16 98 lb (44.5 kg)  12/08/15 101 lb 6.6 oz (46 kg)     Physical Exam  Constitutional: She is oriented to person, place, and time. She appears well-developed. She does not appear ill. No distress.  Underweight  HENT:  Head: Atraumatic.  Mouth/Throat: Oropharynx is clear and moist and mucous membranes are normal.  Eyes: Conjunctivae and EOM are normal. Pupils are equal, round, and reactive to light.  Neck: No tracheal deviation present. No thyromegaly  present.  Cardiovascular: Normal rate and regular rhythm.   No murmur heard. Pulses:      Dorsalis pedis pulses are 2+ on the right side, and 2+ on the left side.  Respiratory: Effort normal and breath sounds normal. No respiratory distress.  GI: Soft. She exhibits no mass. There is no hepatomegaly. There is no tenderness.  Musculoskeletal: She exhibits no edema or tenderness.  Lymphadenopathy:    She has no cervical adenopathy.  Neurological: She is alert and oriented to person, place, and time. She has normal strength. Coordination and gait normal.  Skin: Skin is warm. No erythema.  Tattoos on upper chest and pierce in nose  Psychiatric: She has a normal mood and affect. Her speech is normal.  Well groomed, good eye contact.      ASSESSMENT AND PLAN:     Danikah was seen today for new patient (initial visit).  Diagnoses and all orders for this visit:  Loss of weight  He seems to be attributed to nausea and vomiting, she is reporting weight gain since symptoms  have been improved. She is planning on leaving town in a few weeks and will not be back until September 2017, so she will follow in 2 months.  Gastroesophageal reflux disease without esophagitis  Side effects of PPI discussed. GERD precautions recommended. She will keep appointment with gastroenterologist. Follow-up in 2 months.  -     omeprazole (PRILOSEC) 40 MG capsule; Take 1 capsule (40 mg total) by mouth daily.  Nausea and vomiting in adult  Possible causes discussed: Gastroparesis, GERD, side effects of medications among some. Zofran is not helping, so she will continue Promethazine. I recommended trying half tablet at the time and no more than 2 times per day. Some side effects discussed. Keep appointment with gastroenterologist.  -     promethazine (PHENERGAN) 25 MG tablet; Take 0.5-1 tablets (12.5-25 mg total) by mouth every 12 (twelve) hours as needed for nausea or vomiting.    -I don't think lab work is needed today, she had recent labs during ER visit and she is having appointment with endocrinologist in August/2017, which she is planning on keeping.        Maccoy Haubner G. [place], MD  [facility]. [place] office.

## 2016-03-12 NOTE — Patient Instructions (Signed)
A few things to remember from today's visit:   Loss of weight  Gastroesophageal reflux disease without esophagitis - Plan: omeprazole (PRILOSEC) 40 MG capsule  Nausea and vomiting in adult - Plan: promethazine (PHENERGAN) 25 MG tablet    Avoid foods that make your symptoms worse, for example coffee, chocolate,pepermeint,alcohol, and greasy food. Raising the head of your bed about 6 inches may help with nocturnal symptoms.  Avoid tobacco use. Weight loss (if you are overweight). Avoid lying down for 3 hours after eating.  Instead 3 large meals daily try small and more frequent meals during the day.  Some medications we recommend for acid reflux treatment (proton pump inhibitors) can cause some problems in the long term: increase risk of osteoporosis, vitamin deficiencies,pneumonia, and more recently discovered that it can increase the risk of chronic kidney disease and might increase risk of dementia.  You should be evaluated immediately if bloody vomiting, bloody stools, black stools (like tar), difficulty swallowing, food gets stuck on the way down or choking when eating. Abnormal weight loss or severe abdominal pain.  If symptoms are not resolved sometimes endoscopy is necessary.  Please be sure medication list is accurate. If a new problem present, please set up appointment sooner than planned today.

## 2016-03-12 NOTE — Progress Notes (Signed)
Pre visit review using our clinic review tool, if applicable. No additional management support is needed unless otherwise documented below in the visit note. 

## 2016-04-02 ENCOUNTER — Ambulatory Visit (HOSPITAL_COMMUNITY): Payer: Self-pay | Admitting: Psychiatry

## 2016-04-05 ENCOUNTER — Encounter: Payer: Self-pay | Admitting: Family Medicine

## 2016-05-07 ENCOUNTER — Ambulatory Visit (INDEPENDENT_AMBULATORY_CARE_PROVIDER_SITE_OTHER): Payer: Medicare Other | Admitting: Gastroenterology

## 2016-05-07 ENCOUNTER — Encounter: Payer: Self-pay | Admitting: Gastroenterology

## 2016-05-07 ENCOUNTER — Encounter (INDEPENDENT_AMBULATORY_CARE_PROVIDER_SITE_OTHER): Payer: Self-pay

## 2016-05-07 VITALS — BP 90/50 | HR 69 | Ht 66.0 in | Wt 89.0 lb

## 2016-05-07 DIAGNOSIS — E109 Type 1 diabetes mellitus without complications: Secondary | ICD-10-CM | POA: Diagnosis not present

## 2016-05-07 DIAGNOSIS — G43A0 Cyclical vomiting, not intractable: Secondary | ICD-10-CM

## 2016-05-07 DIAGNOSIS — R1115 Cyclical vomiting syndrome unrelated to migraine: Secondary | ICD-10-CM

## 2016-05-07 DIAGNOSIS — G894 Chronic pain syndrome: Secondary | ICD-10-CM

## 2016-05-07 DIAGNOSIS — I639 Cerebral infarction, unspecified: Secondary | ICD-10-CM

## 2016-05-07 MED ORDER — METOCLOPRAMIDE HCL 5 MG PO TABS: 5.0000 mg | ORAL_TABLET | Freq: Three times a day (TID) | ORAL | 1 refills | Status: AC

## 2016-05-07 NOTE — Patient Instructions (Addendum)
Begin the metoclopramide as ordered.  Stop the promethazine for now, and use ondansetron instead as needed for breakthrough nausea/vomiting.  Watch for possible side effects as we discussed.  See me in about 6 weeks and please make sure your endocrinologist sends me a copy of their office note for your next visit in October.  If you are age 26 or older, your body mass index should be between 23-30. Your Body mass index is 14.36 kg/m. If this is out of the aforementioned range listed, please consider follow up with your Primary Care Provider.  If you are age 26 or younger, your body mass index should be between 19-25. Your Body mass index is 14.36 kg/m. If this is out of the aformentioned range listed, please consider follow up with your Primary Care Provider.   Thank you for choosing [facility]  Dr Amada JupiterHenry Danis III

## 2016-05-07 NOTE — Progress Notes (Signed)
[facility] Consult Note:  History: Jo Pittman 05/07/2016  Referring physician: Betty Swaziland, MD  Reason for consult/chief complaint: nausea and vomiting (patient reports episodes of almost constant nausea and vomiting, often after eating but has also had episodes when stomach was empty)   Subjective  HPI:  This is a 25 year old woman referred to me by primary care for persistent nausea and vomiting with weight loss. She has a complicated history, some of which was obtained from her last PCP note in July, and the rest from the patient. She reports having been diagnosed with sickle cell anemia when she was young, this apparently led to at least one CVA, and she ultimately had a bone marrow transplant. She also describes receiving chemotherapy and radiation for this condition, and this all reportedly led to her becoming an insulin requiring diabetic several years ago. She moved here from [place] earlier this year, and has seen an endocrinologist in [place] as recently as June. She is due to see them sometime next month. For the last several months she has had worsening nausea and vomiting that occurs nearly every day. It will happen whether or not she has been able to eat. She has lost a substantial amount of weight, though the exact amount is unknown. She is currently down to 89 pounds. She was in the emergency department in July and was prescribed Phenergan. That apparently helps, but she can typically only take it at night because it sedates her. She reports that her blood sugars have swings, and they are often quite low. Her last known hemoglobin A1c was 7.9 in January of this year, she says a more recent one was 8.1. Currently, she is taking Phenergan at night and sometimes will also take it during the day with as needed doses of Zofran. In addition, she has had chronic pain syndrome for many years, first from sickle cell and then also from cervical disc condition. She is on  chronic narcotic therapy, using oxycodone 10 mg 3 times a day. This has been managed by her local pain clinic. Jo Pittman denies odynophagia, which she has frequent heartburn which is improved on once daily omeprazole. Her bowel habits are regular and without rectal bleeding.  ROS:  Review of Systems  Constitutional: Negative for appetite change and unexpected weight change.  HENT: Negative for mouth sores and voice change.   Eyes: Negative for pain and redness.  Respiratory: Negative for cough and shortness of breath.   Cardiovascular: Negative for chest pain and palpitations.  Gastrointestinal: Negative for abdominal pain.  Genitourinary: Negative for dysuria and hematuria.  Musculoskeletal: Positive for arthralgias and neck pain. Negative for myalgias.  Skin: Negative for pallor and rash.  Neurological: Negative for weakness and headaches.  Hematological: Negative for adenopathy.  Psychiatric/Behavioral: Positive for dysphoric mood. The patient is nervous/anxious.      Past Medical History: Past Medical History:  Diagnosis Date  . Back pain, chronic   . Diabetes mellitus without complication (HCC)    type I  . Sickle cell anemia (HCC)    HX of, no longer  . Stroke [facility]) 2008   R sided weakness/aneurism     Past Surgical History: Past Surgical History:  Procedure Laterality Date  . APPENDECTOMY    . BONE MARROW TRANSPLANT    . CHOLECYSTECTOMY       Family History: Family History  Problem Relation Age of Onset  . Cancer Paternal Aunt   . Cancer Paternal Grandmother   . Stroke Paternal Grandmother  Social History: Social History   Social History  . Marital status: Single    Spouse name: N/A  . Number of children: N/A  . Years of education: N/A   Social History Main Topics  . Smoking status: Former Smoker    Types: Cigarettes    Quit date: 05/16/2015  . Smokeless tobacco: None  . Alcohol use 2.4 oz/week    4 Shots of liquor per week     Comment: occ   . Drug use:     Types: Marijuana  . Sexual activity: Not Asked   Other Topics Concern  . None   Social History Narrative  . None    Allergies: Allergies  Allergen Reactions  . Vancomycin Itching and Rash    redman syndrome  . Carafate [Sucralfate] Itching  . Morphine And Related Itching    Itching when given IV     Outpatient Meds: Current Outpatient Prescriptions  Medication Sig Dispense Refill  . albuterol (PROVENTIL HFA;VENTOLIN HFA) 108 (90 Base) MCG/ACT inhaler Inhale into the lungs every 6 (six) hours as needed for wheezing or shortness of breath.    . budesonide-formoterol (SYMBICORT) 80-4.5 MCG/ACT inhaler Inhale 2 puffs into the lungs 2 (two) times daily.    . DULoxetine (CYMBALTA) 20 MG capsule Take 1 capsule for 1 week and than 2 daily 60 capsule 1  . Insulin Human (INSULIN PUMP) SOLN Inject 1 each into the skin continuous. "Novolog"    . omeprazole (PRILOSEC) 40 MG capsule Take 1 capsule (40 mg total) by mouth daily. 30 capsule 2  . ondansetron (ZOFRAN) 8 MG tablet Take 8 mg by mouth every 8 (eight) hours as needed for nausea or vomiting.    Marland Kitchen oxycodone (OXY-IR) 5 MG capsule Take 5 mg by mouth every 4 (four) hours as needed.    . promethazine (PHENERGAN) 25 MG tablet Take 0.5-1 tablets (12.5-25 mg total) by mouth every 12 (twelve) hours as needed for nausea or vomiting. 30 tablet 1  . Vitamin D, Ergocalciferol, (DRISDOL) 50000 units CAPS capsule Take 50,000 Units by mouth every Wednesday.     . metoCLOPramide (REGLAN) 5 MG tablet Take 1 tablet (5 mg total) by mouth 3 (three) times daily before meals. 90 tablet 1   No current facility-administered medications for this visit.       ___________________________________________________________________ Objective   Exam:  BP (!) 90/50   Pulse 69   Ht 5\' 6"  (1.676 m)   Wt 89 lb (40.4 kg)   LMP 04/21/2016   BMI 14.36 kg/m    General: this is a(n) Petite and very thin young woman in no acute distress. She is  visibly anxious.   Eyes: sclera anicteric, no redness  ENT: oral mucosa moist without lesions, no cervical or supraclavicular lymphadenopathy, good dentition  CV: RRR without murmur, S1/S2, no JVD, no peripheral edema  Resp: clear to auscultation bilaterally, normal RR and effort noted  GI: soft, no tenderness, with active bowel sounds. No guarding or palpable organomegaly noted.  Skin; warm and dry, no rash or jaundice noted. Multiple tattoos  Neuro: awake, alert and oriented x 3. Normal gross motor function and fluent speech  Labs:  CMP Latest Ref Rng & Units 03/03/2016 12/08/2015 12/08/2015  Glucose 65 - 99 mg/dL 161(W) 960(A) 540(J)  BUN 6 - 20 mg/dL 12 12 10   Creatinine 0.44 - 1.00 mg/dL 8.11 9.14 7.82  Sodium 135 - 145 mmol/L 135 140 140  Potassium 3.5 - 5.1 mmol/L 4.0 4.0 4.0  Chloride 101 - 111 mmol/L 103 105 107  CO2 22 - 32 mmol/L 23 - 19(L)  Calcium 8.9 - 10.3 mg/dL 9.7 - 16.110.3  Total Protein 6.5 - 8.1 g/dL 8.0 - 8.3(H)  Total Bilirubin 0.3 - 1.2 mg/dL 0.7 - 0.8  Alkaline Phos 38 - 126 U/L 99 - 95  AST 15 - 41 U/L 30 - 23  ALT 14 - 54 U/L 30 - 15  Albumin 5.0  CBC    Component Value Date/Time   WBC 10.0 03/03/2016 0550   RBC 3.78 (L) 03/03/2016 0550   HGB 13.2 03/03/2016 0550   HCT 36.5 03/03/2016 0550   PLT 198 03/03/2016 0550   MCV 96.6 03/03/2016 0550   MCH 34.9 (H) 03/03/2016 0550   MCHC 36.2 (H) 03/03/2016 0550   RDW 11.4 (L) 03/03/2016 0550   LYMPHSABS 3.6 03/03/2016 0550   MONOABS 1.2 (H) 03/03/2016 0550   EOSABS 0.1 03/03/2016 0550   BASOSABS 0.0 03/03/2016 0550    Hgb A1C 7.9 in Jan 17  Radiologic Studies:  Negative noncontrast head CT 12/08/2015, reportedly done for right-sided weakness and concern for CVA.  Assessment: Encounter Diagnoses  Name Primary?  . Type 1 diabetes mellitus without complication (HCC) Yes  . Non-intractable cyclical vomiting with nausea   . Chronic pain syndrome     I spent a total time of 60 minutes with this  patient in the clinic today. She has a complicated case of intractable nausea and vomiting that appears to be due to insulin requiring diabetes most likely causing gastropathy. That might take the form of delayed gastric emptying or perhaps a gastric dysrhythmia. Even though she states adamantly that these symptoms began long after she began chronic narcotic medicines, those medicines are almost certainly exacerbating the problem. Nevertheless, she appears to need them to remain functional, and itdoes not seem she will be decreasing the dose or stopping them in the for the forseeable future, nor did I suggest that she should do so.  I discussed the need for an upper endoscopy as part of the usual workup in this condition in order to rule out gastric outlet obstruction. However, I feel that now is not the best time for that since she has persistent vomiting, risking volume depletion and more glucose and electrolyte derangements. I would therefore like to get her symptoms under better control so she can feel better and be more functional, but also to make an endoscopy safer.  Her current medications have not sufficiently controlled the symptoms. For, I recommended a trial of metoclopramide 5 mg 3 times a day before meals, during which time she would stop the Phenergan and use as needed Zofran. I discussed the possible tardive dyskinesias side effect of metoclopramide, and describes what that might look like if it should occur. She understands that the risk is low with short-term use, but it is an idiosyncratic reaction and can be permanent even if the medicines are stopped immediately.  After this discussion, she became visibly more upset, tearful and anxious and says "I think I'm just going to have to move back to [place] She felt that I did not understand everything she had been going through, and that I was not planning to do any other tests "to really understand the problem". She felt that she was tired  of just being given more medicines for this. She wanted to know why her endocrinologist had not sought this workup earlier. Frankly, her cumulative frustration with her medical issues to  date and experience with some physicians seems to be the true source of her dissatisfaction today.  It was difficult for me to ascertain her expectations regarding her visit today. I explained my very particular reasoning for not pursuing further tests at this time. I'm fairly certain of the diagnosis because of the entire clinical picture, and my suspicion for gastric outlet obstruction is low. Upper endoscopy is a proper part of that workup. However, for the reasons mentioned above, I do not think now is the best time to pursue it. In addition, a gastric emptying study would not change my management right now. If it shows delay, then I would start her on Reglan. If it shows normal emptying, then I'm still suspicious of a gastric dysrhythmia as the cause of symptoms, and I would start Reglan.  Ultimately, she was agreeable to the plan and all of her questions were answered.  Plan:  Metoclopramide 5 mg three times daily before meals. Stop phenergan Use zofran as needed. Keep glucose log (with pump) and give to endocrinologist at upcoming visit. See me in 6 weeks.   Thank you for the courtesy of this consult.  Please call me with any questions or concerns.  Charlie Pitter III  CC: Betty Swaziland, MD

## 2016-05-14 ENCOUNTER — Ambulatory Visit (INDEPENDENT_AMBULATORY_CARE_PROVIDER_SITE_OTHER): Payer: Medicare Other | Admitting: Family Medicine

## 2016-05-14 ENCOUNTER — Encounter: Payer: Self-pay | Admitting: Family Medicine

## 2016-05-14 VITALS — BP 82/62 | Resp 12 | Ht 66.0 in | Wt 91.6 lb

## 2016-05-14 DIAGNOSIS — K219 Gastro-esophageal reflux disease without esophagitis: Secondary | ICD-10-CM

## 2016-05-14 DIAGNOSIS — J4520 Mild intermittent asthma, uncomplicated: Secondary | ICD-10-CM | POA: Diagnosis not present

## 2016-05-14 DIAGNOSIS — G894 Chronic pain syndrome: Secondary | ICD-10-CM

## 2016-05-14 DIAGNOSIS — Z23 Encounter for immunization: Secondary | ICD-10-CM | POA: Diagnosis not present

## 2016-05-14 DIAGNOSIS — I639 Cerebral infarction, unspecified: Secondary | ICD-10-CM | POA: Diagnosis not present

## 2016-05-14 DIAGNOSIS — R110 Nausea: Secondary | ICD-10-CM | POA: Diagnosis not present

## 2016-05-14 DIAGNOSIS — F339 Major depressive disorder, recurrent, unspecified: Secondary | ICD-10-CM

## 2016-05-14 DIAGNOSIS — R112 Nausea with vomiting, unspecified: Secondary | ICD-10-CM

## 2016-05-14 MED ORDER — BUDESONIDE-FORMOTEROL FUMARATE 80-4.5 MCG/ACT IN AERO: 2.0000 | INHALATION_SPRAY | Freq: Two times a day (BID) | RESPIRATORY_TRACT | 3 refills | Status: AC

## 2016-05-14 MED ORDER — OMEPRAZOLE 20 MG PO CPDR: 20.0000 mg | DELAYED_RELEASE_CAPSULE | Freq: Every day | ORAL | 1 refills | Status: DC

## 2016-05-14 MED ORDER — ALBUTEROL SULFATE HFA 108 (90 BASE) MCG/ACT IN AERS: 2.0000 | INHALATION_SPRAY | Freq: Four times a day (QID) | RESPIRATORY_TRACT | 2 refills | Status: DC | PRN

## 2016-05-14 NOTE — Progress Notes (Signed)
Pre visit review using our clinic review tool, if applicable. No additional management support is needed unless otherwise documented below in the visit note. 

## 2016-05-14 NOTE — Progress Notes (Signed)
HPI:   Jo Pittman is a 25 y.o. female, who is here today to follow on nausea, vomiting, and wt loss.   I saw Jo Pittman last on 03/12/2016, at that time she was reporting 8-10 months of nausea and vomiting that was getting worse for the past 4 months.  Because history of GERD, I recommended starting with omeprazole 40 mg daily. Since her last OV she was evaluated by GI and according to patient, she was started on Reglan, which she has been taken for about a week. She has noted "massively" improvement in nausea and vomiting, she is still having nausea but is very minimal. She still is not eating well, trying to increase protein intake.   She has tolerated the medication well and denies any side effect.  Asthma: Today she is requesting a refill on Symbicort 80-4.5 g, which she takes twice per day. She also takes albuterol inhaler as needed, which she does not need frequently as far as she takes her Symbicort daily.   Concerns today: History of chronic pain, anxiety, and depression. She tells me that she is unhappy with pain clinic provider and psychiatrists. According to patient her psychiatrist has canceled her to last appointments, she was last seen in July 2017. She denies any history of depression, currently she is on Cymbalta 40 mg daily but she doesn't feel like it is helping. She has history of insomnia, in the past she has been on Alprazolam at bedtime, which helped with her mind "racing."  She has been on Methadone, Morphine, and lately Oxycodone. Pain medication initially started because sickle cell disease, she also has history of chronic cervical and lumbar back with radiculopathy of left lower extremity. Since 03/2015 she has been following with pain management  In regard to pain clinic, she is complaining about long hours of waiting but later during visit she mentions that Hydrocodone been reported or was reported in urine tox, she states that she is not sure  for how long this medication stays in urine, states that she just took one dose of Hydrocodone-Acetaminophen provided by a friend right before she established with pain management because she had ran out of her Oxycodone. She states that pain medication was recommended tid but she was still taken 1-2 more than recommended because "severe" pain.  She tells me that she is already out of her pain medications she is not specific about last time she was seen and when her next appointment is supposed to be. She would like to establish with another psychiatrist and pain manager.  She denies any suicidal thoughts.   Since her last OV she has followed with Dr Myrtie Neither for DM I.  Lab Results  Component Value Date   WBC 10.0 03/03/2016   HGB 13.2 03/03/2016   HCT 36.5 03/03/2016   MCV 96.6 03/03/2016   PLT 198 03/03/2016     Chemistry      Component Value Date/Time   NA 135 03/03/2016 0550   NA 135 (A) 07/09/2015   K 4.0 03/03/2016 0550   CL 103 03/03/2016 0550   CO2 23 03/03/2016 0550   BUN 12 03/03/2016 0550   BUN 7 07/09/2015   CREATININE 0.58 03/03/2016 0550   CREATININE 0.71 09/09/2015 1250   GLU 275 07/09/2015      Component Value Date/Time   CALCIUM 9.7 03/03/2016 0550   ALKPHOS 99 03/03/2016 0550   AST 30 03/03/2016 0550   ALT 30 03/03/2016 0550  BILITOT 0.7 03/03/2016 0550       Review of Systems  Constitutional: Positive for fatigue (No more than usual). Negative for activity change, diaphoresis and fever.  HENT: Negative for mouth sores, nosebleeds, sore throat, trouble swallowing and voice change.   Eyes: Negative for redness and visual disturbance.  Respiratory: Negative for cough, shortness of breath and wheezing.   Cardiovascular: Negative for palpitations and leg swelling.  Gastrointestinal: Positive for diarrhea, nausea and vomiting. Negative for abdominal pain and blood in stool.       No changes in bowel habits  Genitourinary: Negative for decreased urine  volume, difficulty urinating and hematuria.  Musculoskeletal: Positive for arthralgias, back pain and neck pain. Negative for myalgias.  Skin: Negative for color change and rash.  Neurological: Negative for seizures, syncope, weakness, numbness and headaches.  Psychiatric/Behavioral: Positive for sleep disturbance. Negative for confusion and suicidal ideas. The patient is nervous/anxious.       Current Outpatient Prescriptions on File Prior to Visit  Medication Sig Dispense Refill  . Insulin Human (INSULIN PUMP) SOLN Inject 1 each into the skin continuous. "Novolog"    . metoCLOPramide (REGLAN) 5 MG tablet Take 1 tablet (5 mg total) by mouth 3 (three) times daily before meals. 90 tablet 1  . ondansetron (ZOFRAN) 8 MG tablet Take 8 mg by mouth every 8 (eight) hours as needed for nausea or vomiting.    Marland Kitchen oxycodone (OXY-IR) 5 MG capsule Take 5 mg by mouth every 4 (four) hours as needed.    . Vitamin D, Ergocalciferol, (DRISDOL) 50000 units CAPS capsule Take 50,000 Units by mouth every Wednesday.     . DULoxetine (CYMBALTA) 20 MG capsule Take 1 capsule for 1 week and than 2 daily (Patient not taking: Reported on 05/14/2016) 60 capsule 1   No current facility-administered medications on file prior to visit.      Past Medical History:  Diagnosis Date  . Back pain, chronic   . Diabetes mellitus without complication (HCC)    type I  . Sickle cell anemia (HCC)    HX of, no longer  . Stroke [facility]) 2008   R sided weakness/aneurism   Allergies  Allergen Reactions  . Vancomycin Itching and Rash    redman syndrome  . Carafate [Sucralfate] Itching  . Morphine And Related Itching    Itching when given IV     Social History   Social History  . Marital status: Single    Spouse name: N/A  . Number of children: N/A  . Years of education: N/A   Social History Main Topics  . Smoking status: Former Smoker    Types: Cigarettes    Quit date: 05/16/2015  . Smokeless tobacco: None  . Alcohol  use 2.4 oz/week    4 Shots of liquor per week     Comment: occ  . Drug use:     Types: Marijuana  . Sexual activity: Not Asked   Other Topics Concern  . None   Social History Narrative  . None    Vitals:   05/14/16 1003  BP: (!) 82/62  Resp: 12   Body mass index is 14.78 kg/m.  Wt Readings from Last 3 Encounters:  05/14/16 91 lb 9 oz (41.5 kg)  05/07/16 89 lb (40.4 kg)  03/12/16 95 lb 2 oz (43.1 kg)     Physical Exam  Nursing note and vitals reviewed. Constitutional: She is oriented to person, place, and time. She appears well-developed. No distress.  Underweight  HENT:  Head: Atraumatic.  Mouth/Throat: Oropharynx is clear and moist and mucous membranes are normal.  Eyes: Conjunctivae and EOM are normal. Pupils are equal, round, and reactive to light.  Cardiovascular: Normal rate and regular rhythm.   No murmur heard. Pulses:      Dorsalis pedis pulses are 2+ on the right side, and 2+ on the left side.  Respiratory: Effort normal and breath sounds normal. No respiratory distress.  GI: Soft. She exhibits no mass. There is no hepatomegaly. There is no tenderness.  Musculoskeletal: She exhibits no edema or tenderness.  Neurological: She is alert and oriented to person, place, and time. She has normal strength. No cranial nerve deficit. Coordination and gait normal.  Skin: Skin is warm. No erythema.  Psychiatric: Her speech is normal. Her mood appears anxious. Cognition and memory are normal. She expresses no suicidal ideation. She expresses no suicidal plans.  Well groomed, good eye contact.      ASSESSMENT AND PLAN:     Jo Pittman was seen today for follow-up.  Diagnoses and all orders for this visit:  Nausea and vomiting in adult  ? Gastroparesis. Improved. No changes in Reglan. Keep f/u appt with GI 05/2016.  Gastroesophageal reflux disease without esophagitis  Improved.  Decrease Omeprazole from 40 mg to 20 mg. Continue GERD precautions, small  and frequent meals.  -     omeprazole (PRILOSEC) 20 MG capsule; Take 1 capsule (20 mg total) by mouth daily.  Asthma, mild intermittent, uncomplicated  Well controlled. No changes in current management. F/U in 12 months, before if needed.  -     budesonide-formoterol (SYMBICORT) 80-4.5 MCG/ACT inhaler; Inhale 2 puffs into the lungs 2 (two) times daily. -     albuterol (PROVENTIL HFA;VENTOLIN HFA) 108 (90 Base) MCG/ACT inhaler; Inhale 2 puffs into the lungs every 6 (six) hours as needed for wheezing or shortness of breath.  Need for immunization against influenza -     Flu Vaccine QUAD 36+ mos IM  Chronic pain disorder  We discussed current guidelines for pain management and opioid prescriptions, explained that it may take sometime to be able to establish with another provider. She understands but still she would like a referral to another pain clinic.   -     Ambulatory referral to Pain Clinic  Major depression, recurrent, chronic (HCC)  Instructed about warning signs. I encouraged her to try to stay with the same psychiatrist but she really would like another one. She needs to find out providers that are covered by her insurance, she can set up appt, explained that usually referral is not needed but I will be glad to do so if requested by provider's office. She voices understanding.    OV face to face from 10:12 am-10:40 am. Over 50% of time was dedicated for discussion of current recommendations for chronic pain management, some side effects of different medications used for psychotic disorders, reasonsfor which benzodiazepines are not recommended for sleep disorders.      -Ms. Jo Pittman was advised to return sooner than planned today if new concerns arise.       Quantez Schnyder G. SwazilandJordan, MD  [facility]. [place] office.

## 2016-05-14 NOTE — Patient Instructions (Signed)
A few things to remember from today's visit:   Nausea and vomiting in adult  Gastroesophageal reflux disease without esophagitis  Asthma, mild intermittent, uncomplicated  Appointment with new pain management will be arrange.  Please be sure medication list is accurate. If a new problem present, please set up appointment sooner than planned today.

## 2016-05-17 ENCOUNTER — Ambulatory Visit (HOSPITAL_COMMUNITY): Payer: Medicare Other | Admitting: Psychiatry

## 2016-05-28 ENCOUNTER — Ambulatory Visit (INDEPENDENT_AMBULATORY_CARE_PROVIDER_SITE_OTHER): Payer: Medicare Other | Admitting: Psychology

## 2016-05-28 DIAGNOSIS — F411 Generalized anxiety disorder: Secondary | ICD-10-CM | POA: Diagnosis not present

## 2016-06-08 ENCOUNTER — Ambulatory Visit: Payer: Self-pay | Admitting: Psychology

## 2016-06-15 ENCOUNTER — Ambulatory Visit: Payer: Medicare Other | Admitting: Psychology

## 2016-06-22 ENCOUNTER — Ambulatory Visit: Payer: Medicare Other | Admitting: Psychology

## 2016-06-24 ENCOUNTER — Ambulatory Visit: Payer: Medicare Other | Admitting: Gastroenterology

## 2016-06-24 ENCOUNTER — Other Ambulatory Visit: Payer: Self-pay

## 2016-06-29 ENCOUNTER — Ambulatory Visit: Payer: Medicare Other | Admitting: Psychology

## 2016-07-14 ENCOUNTER — Ambulatory Visit (INDEPENDENT_AMBULATORY_CARE_PROVIDER_SITE_OTHER): Payer: Medicare Other | Admitting: Gastroenterology

## 2016-07-14 ENCOUNTER — Encounter: Payer: Self-pay | Admitting: Gastroenterology

## 2016-07-14 VITALS — BP 84/60 | HR 84 | Ht 66.0 in | Wt 93.8 lb

## 2016-07-14 DIAGNOSIS — E109 Type 1 diabetes mellitus without complications: Secondary | ICD-10-CM | POA: Diagnosis not present

## 2016-07-14 DIAGNOSIS — I639 Cerebral infarction, unspecified: Secondary | ICD-10-CM | POA: Diagnosis not present

## 2016-07-14 DIAGNOSIS — R112 Nausea with vomiting, unspecified: Secondary | ICD-10-CM | POA: Diagnosis not present

## 2016-07-14 NOTE — Progress Notes (Signed)
 GI Progress Note  Chief Complaint: Nausea and vomiting with weight loss  Subjective  History:  Jo Pittman was seen in follow-up for nausea vomiting and weight loss after her initial visit with me 2 months ago. She is on metoclopramide 5 mg 3 times daily before meals, and I'm glad to say she is much improved. She still has intermittent nausea for which she takes Zofran perhaps once a week. She no longer uses the Phenergan because it makes her sleepy. Her weight is gone up 4 pounds, and she reports that her hemoglobin A1c decreased from 8.4-7.1, having recently seen her endocrinologist. While there is still some intermittent nausea, she no longer has vomiting. She is a little afraid to eat and uncertain what she should eat. That reason, I am referring her to a dietitian. Lastly, she reports a decrease in her stress level since her symptoms have improved and she is changing her living situation after January 1. ROS: Cardiovascular:  no chest pain Respiratory: no dyspnea  The patient's Past Medical, Family and Social History were reviewed and are on file in the EMR.  Objective:  Med list reviewed  Vital signs in last 24 hrs: Vitals:   07/14/16 1135  BP: (!) 84/60  Pulse: 84    Physical Exam   Well-appearing, pleasant affect . She is petite and quite thin as before    HEENT: sclera anicteric, oral mucosa moist without lesions  Neck: supple, no thyromegaly, JVD or lymphadenopathy  Cardiac: RRR without murmurs, S1S2 heard, no peripheral edema  Pulm: clear to auscultation bilaterally, normal RR and effort noted  Abdomen: soft, no tenderness, with active bowel sounds. No guarding or palpable hepatosplenomegaly.  Skin; warm and dry, no jaundice or rash   @ASSESSMENTPLANBEGIN @ Assessment: Encounter Diagnoses  Name Primary?  . Nausea and vomiting, intractability of vomiting not specified, unspecified vomiting type Yes  . Type 1 diabetes mellitus without complication  (HCC)     Nausea and vomiting, probable diabetic gastropathy Type 1 diabetes on insulin pump, previously poor control but now improving. Chronic pain syndrome on opioid analgesics, probable contributor to the upper GI symptoms. Anxiety disorder that is lately improved.  Plan: EGD to rule out mucosal or obstructive cause of symptoms. She is agreeable.  The benefits and risks of the planned procedure were described in detail with the patient or (when appropriate) their health care proxy.  Risks were outlined as including, but not limited to, bleeding, infection, perforation, adverse medication reaction leading to cardiac or pulmonary decompensation, or pancreatitis (if ERCP).  The limitation of incomplete mucosal visualization was also discussed.  No guarantees or warranties were given.  Continue metoclopramide at the current dose I told her that after another couple of months and will probably decreased to twice daily in an attempt to discover the threshold dose for symptom control. He again reviewed the possible risk of tardive dyskinesia. At this point I think the benefits outweigh the risks.  Total time 20 minutes, over half spent in counseling and coordination of care.   Charlie PitterHenry L Danis III  Dr Casimiro NeedleMichael Altheimer (Endocrinology)

## 2016-07-14 NOTE — Patient Instructions (Signed)
You have been scheduled for an endoscopy. Please follow written instructions given to you at your visit today. If you use inhalers (even only as needed), please bring them with you on the day of your procedure. Your physician has requested that you go to www.startemmi.com and enter the access code given to you at your visit today. This web site gives a general overview about your procedure. However, you should still follow specific instructions given to you by our office regarding your preparation for the procedure.  Thank you for choosing [facility]  Dr Henry Danis III 

## 2016-07-15 ENCOUNTER — Encounter: Payer: Self-pay | Admitting: Gastroenterology

## 2016-07-15 ENCOUNTER — Other Ambulatory Visit: Payer: Self-pay

## 2016-07-15 ENCOUNTER — Ambulatory Visit (AMBULATORY_SURGERY_CENTER): Payer: Medicare Other | Admitting: Gastroenterology

## 2016-07-15 VITALS — BP 122/72 | HR 85 | Temp 97.5°F | Resp 17 | Ht 66.0 in | Wt 93.0 lb

## 2016-07-15 DIAGNOSIS — E109 Type 1 diabetes mellitus without complications: Secondary | ICD-10-CM

## 2016-07-15 DIAGNOSIS — R112 Nausea with vomiting, unspecified: Secondary | ICD-10-CM | POA: Diagnosis not present

## 2016-07-15 DIAGNOSIS — K219 Gastro-esophageal reflux disease without esophagitis: Secondary | ICD-10-CM

## 2016-07-15 MED ORDER — SODIUM CHLORIDE 0.9 % IV SOLN: 500.0000 mL | INTRAVENOUS | Status: DC

## 2016-07-15 NOTE — Patient Instructions (Signed)
YOU HAD AN ENDOSCOPIC PROCEDURE TODAY AT THE [facility]:   Refer to the procedure report that was given to you for any specific questions about what was found during the examination.  If the procedure report does not answer your questions, please call your gastroenterologist to clarify.  If you requested that your care partner not be given the details of your procedure findings, then the procedure report has been included in a sealed envelope for you to review at your convenience later.  YOU SHOULD EXPECT: Some feelings of bloating in the abdomen. Passage of more gas than usual.  Walking can help get rid of the air that was put into your GI tract during the procedure and reduce the bloating.   Please Note:  You might notice some irritation and congestion in your nose or some drainage.  This is from the oxygen used during your procedure.  There is no need for concern and it should clear up in a day or so.  SYMPTOMS TO REPORT IMMEDIATELY:    Following upper endoscopy (EGD)  Vomiting of blood or coffee ground material  New chest pain or pain under the shoulder blades  Painful or persistently difficult swallowing  New shortness of breath  Fever of 100F or higher  Black, tarry-looking stools  For urgent or emergent issues, a gastroenterologist can be reached at any hour by calling [phone].   DIET:  We do recommend a small meal at first, but then you may proceed to your regular diet.  Drink plenty of fluids but you should avoid alcoholic beverages for 24 hours.  ACTIVITY:  You should plan to take it easy for the rest of today and you should NOT DRIVE or use heavy machinery until tomorrow (because of the sedation medicines used during the test).    FOLLOW UP: Our staff will call the number listed on your records the next business day following your procedure to check on you and address any questions or concerns that you may have regarding the information given to you  following your procedure. If we do not reach you, we will leave a message.  However, if you are feeling well and you are not experiencing any problems, there is no need to return our call.  We will assume that you have returned to your regular daily activities without incident.  If any biopsies were taken you will be contacted by phone or by letter within the next 1-3 weeks.  Please call us at [phone] if you have not heard about the biopsies in 3 weeks.    SIGNATURES/CONFIDENTIALITY: You and/or your care partner have signed paperwork which will be entered into your electronic medical record.  These signatures attest to the fact that that the information above on your After Visit Summary has been reviewed and is understood.  Full responsibility of the confidentiality of this discharge information lies with you and/or your care-partner.  Thank-you for choosing us for your healthcare needs today. 

## 2016-07-15 NOTE — Progress Notes (Signed)
Report to PACU, RN, vss, BBS= Clear.  

## 2016-07-15 NOTE — Op Note (Signed)
[facility] Patient Name: Jo Pittman Procedure Date: 07/15/2016 8:20 AM MRN: [id number] Endoscopist: Sherilyn CooterHenry L. Myrtie Neitheranis , MD Age: 25 Referring MD:  Date of Birth: Dec 07, 1990 Gender: Female Account #: [id number] Procedure:                Upper GI endoscopy Indications:              Nausea with vomiting Medicines:                Monitored Anesthesia Care Procedure:                Pre-Anesthesia Assessment:                           - Prior to the procedure, a History and Physical                            was performed, and patient medications and                            allergies were reviewed. The patient's tolerance of                            previous anesthesia was also reviewed. The risks                            and benefits of the procedure and the sedation                            options and risks were discussed with the patient.                            All questions were answered, and informed consent                            was obtained. Prior Anticoagulants: The patient has                            taken no previous anticoagulant or antiplatelet                            agents. ASA Grade Assessment: II - A patient with                            mild systemic disease. After reviewing the risks                            and benefits, the patient was deemed in                            satisfactory condition to undergo the procedure.                           After obtaining informed consent, the endoscope was  passed under direct vision. Throughout the                            procedure, the patient's blood pressure, pulse, and                            oxygen saturations were monitored continuously. The                            Model GIF-[id number]) scope was introduced                            through the mouth, and advanced to the second part                            of duodenum. The upper GI  endoscopy was                            accomplished without difficulty. The patient                            tolerated the procedure well. Scope In: Scope Out: Findings:                 The esophagus was normal.                           The stomach was normal.                           The cardia and gastric fundus were normal on                            retroflexion.                           The examined duodenum was normal. Complications:            No immediate complications. Estimated Blood Loss:     Estimated blood loss: none. Impression:               - Normal esophagus.                           - Normal stomach.                           - Normal examined duodenum.                           - No specimens collected.                           - Diabetic gastropathy that has lately been                            improved on metoclopramide. Recommendation:           - Patient has a contact  number available for                            emergencies. The signs and symptoms of potential                            delayed complications were discussed with the                            patient. Return to normal activities tomorrow.                            Written discharge instructions were provided to the                            patient.                           - Resume previous diet.                           - Continue present medications.                           Follow up as scheduled in GI clinic. Leily Capek L. Myrtie Neitheranis, MD 07/15/2016 8:38:41 AM This report has been signed electronically.

## 2016-07-16 ENCOUNTER — Telehealth: Payer: Self-pay

## 2016-07-16 NOTE — Telephone Encounter (Signed)
  Follow up Call-  Call back number 07/15/2016  Post procedure Call Back phone  # [phone]  Permission to leave phone message Yes     Patient questions:  Do you have a fever, pain , or abdominal swelling? No. Pain Score  0 *  Have you tolerated food without any problems? Yes.    Have you been able to return to your normal activities? Yes.  Do you have any questions about your discharge instructions: Diet   No. Medications  No. Follow up visit  No.  Do you have questions or concerns about your Care? No.  Actions: * If pain score is 4 or above: No action needed, pain <4.

## 2016-07-23 ENCOUNTER — Ambulatory Visit: Payer: Self-pay | Admitting: Family Medicine

## 2016-09-13 ENCOUNTER — Encounter: Payer: Self-pay | Admitting: Family Medicine

## 2016-09-13 ENCOUNTER — Ambulatory Visit (INDEPENDENT_AMBULATORY_CARE_PROVIDER_SITE_OTHER): Payer: Medicare Other | Admitting: Family Medicine

## 2016-09-13 ENCOUNTER — Other Ambulatory Visit (HOSPITAL_COMMUNITY)
Admission: RE | Admit: 2016-09-13 | Discharge: 2016-09-13 | Disposition: A | Discharge status: Home / Self Care | Payer: Medicare Other | Source: Ambulatory Visit | Attending: Family Medicine | Admitting: Family Medicine

## 2016-09-13 VITALS — BP 100/78 | HR 92 | Resp 12 | Ht 66.0 in | Wt 95.5 lb

## 2016-09-13 DIAGNOSIS — Z113 Encounter for screening for infections with a predominantly sexual mode of transmission: Secondary | ICD-10-CM | POA: Insufficient documentation

## 2016-09-13 DIAGNOSIS — F411 Generalized anxiety disorder: Secondary | ICD-10-CM | POA: Diagnosis not present

## 2016-09-13 DIAGNOSIS — J4520 Mild intermittent asthma, uncomplicated: Secondary | ICD-10-CM | POA: Diagnosis not present

## 2016-09-13 DIAGNOSIS — K219 Gastro-esophageal reflux disease without esophagitis: Secondary | ICD-10-CM | POA: Diagnosis not present

## 2016-09-13 DIAGNOSIS — J069 Acute upper respiratory infection, unspecified: Secondary | ICD-10-CM

## 2016-09-13 MED ORDER — ALBUTEROL SULFATE HFA 108 (90 BASE) MCG/ACT IN AERS: 2.0000 | INHALATION_SPRAY | Freq: Four times a day (QID) | RESPIRATORY_TRACT | 2 refills | Status: AC | PRN

## 2016-09-13 NOTE — Progress Notes (Signed)
Pre visit review using our clinic review tool, if applicable. No additional management support is needed unless otherwise documented below in the visit note. 

## 2016-09-13 NOTE — Progress Notes (Signed)
HPI:   Jo Pittman is a 26 y.o. female, who is here today to follow on some chronic medical problems.   She has Hx of GERD,chronic pain, anxiety,and asthma. Last seen 05/14/16, we have been following wt loss, attributed to GERD/GI issues. She is reporting improvement in GI symptoms, she is eating better, tolerating food and not having vomiting, nausea has greatly improved. She is still following with GI, she is on Metoclopramide and Omeprazole.    Since her last OV, she has separated from her partner,she moved with her sister. In general this has helped her anxiety.  She is following with psychotherapist. She has not taken the Cymbalta, does not feel like she needs it. She denies depressed mood,insomnia,or suicidal thoughts.  Asthma: She is currently on Symbicort 80-4.5 mcg bid and Albuterol inh as needed. She does not need Albuterol inh often.Last time she had exacerbation was a couple weeks ago when she was Dx with influenza. She was seen at [facility] on 09/09/15 , according to pt, she was treated with abx, still coughing with occasional greenish sputum.She is not longer having fever or chills.   Concerns today: STD and ? Pregnancy.  Today she is requesting pregnancy test and STD screening. Reporting unprotected sex intercourse in 08/14/16 and 08/30/16, same individual. According to pt, he is a "good" man and no Hx of STD's or risk factors. She noted some yellowish discharge a few days ago and resolved.   "Very bad unusual" cramps like menstrual last week.  She was also having nausea but different to the one she was having last OV (resolved). All these above symptoms have improved but she wants to be sure she does not have a STD.   LMP1/6/18 normal.   -In regard to chronic pain, she is following with pain specialist, [facility], has an appt coming.    Review of Systems  Constitutional: Positive for fatigue. Negative for appetite change and fever.  HENT: Positive for  postnasal drip, rhinorrhea and sore throat. Negative for mouth sores, nosebleeds, sinus pain and trouble swallowing.   Respiratory: Positive for cough. Negative for shortness of breath and wheezing.   Cardiovascular: Negative for chest pain, palpitations and leg swelling.  Gastrointestinal: Positive for nausea. Negative for abdominal pain and vomiting.       Negative for changes in bowel habits.  Genitourinary: Negative for decreased urine volume, dyspareunia, dysuria and hematuria.  Musculoskeletal: Positive for arthralgias and back pain. Negative for gait problem.  Skin: Negative for rash.  Neurological: Negative for syncope, weakness and headaches.  Psychiatric/Behavioral: Negative for confusion and sleep disturbance. The patient is not nervous/anxious.       Current Outpatient Prescriptions on File Prior to Visit  Medication Sig Dispense Refill  . budesonide-formoterol (SYMBICORT) 80-4.5 MCG/ACT inhaler Inhale 2 puffs into the lungs 2 (two) times daily. 3 Inhaler 3  . Insulin Human (INSULIN PUMP) SOLN Inject 1 each into the skin continuous. "Novolog"    . metoCLOPramide (REGLAN) 5 MG tablet Take 1 tablet (5 mg total) by mouth 3 (three) times daily before meals. 90 tablet 1  . omeprazole (PRILOSEC) 20 MG capsule Take 1 capsule (20 mg total) by mouth daily. 90 capsule 1  . Vitamin D, Ergocalciferol, (DRISDOL) 50000 units CAPS capsule Take 50,000 Units by mouth every Wednesday.      Current Facility-Administered Medications on File Prior to Visit  Medication Dose Route Frequency Provider Last Rate Last Dose  . 0.9 %  sodium chloride infusion  500 mL Intravenous Continuous Sherrilyn RistHenry L Danis III, MD         Past Medical History:  Diagnosis Date  . Back pain, chronic   . Diabetes mellitus without complication (HCC)    type I  . Sickle cell anemia (HCC)    HX of, no longer  . Stroke [facility](HCC) 2008   R sided weakness/aneurism   Allergies  Allergen Reactions  . Vancomycin Itching and Rash      redman syndrome  . Carafate [Sucralfate] Itching  . Morphine And Related Itching    Itching when given IV     Social History   Social History  . Marital status: Single    Spouse name: N/A  . Number of children: N/A  . Years of education: N/A   Social History Main Topics  . Smoking status: Former Smoker    Types: Cigarettes    Quit date: 05/16/2015  . Smokeless tobacco: Never Used  . Alcohol use 2.4 oz/week    4 Shots of liquor per week     Comment: occ  . Drug use: Yes    Types: Marijuana  . Sexual activity: Not Asked   Other Topics Concern  . None   Social History Narrative  . None    Vitals:   09/13/16 1044  BP: 100/78  Pulse: 92  Resp: 12  O2 sat at RA 95% Body mass index is 15.41 kg/m.  Wt Readings from Last 3 Encounters:  09/13/16 95 lb 8 oz (43.3 kg)  07/15/16 93 lb (42.2 kg)  07/14/16 93 lb 12.8 oz (42.5 kg)   05/14/16:  91 lb 9 oz   Physical Exam  Nursing note and vitals reviewed. Constitutional: She is oriented to person, place, and time. She appears well-developed. No distress.  HENT:  Head: Atraumatic.  Mouth/Throat: Uvula is midline and mucous membranes are normal. Posterior oropharyngeal erythema (Minimal) present. No oropharyngeal exudate or posterior oropharyngeal edema.  Post nasal drainage.  Eyes: Conjunctivae and EOM are normal. Pupils are equal, round, and reactive to light.  Cardiovascular: Normal rate and regular rhythm.   No murmur heard. Respiratory: Effort normal and breath sounds normal. No respiratory distress.  GI: Soft. She exhibits no mass. There is no hepatomegaly. There is no tenderness.  Musculoskeletal: She exhibits no edema.       Left hip: She exhibits decreased range of motion (mild) and tenderness.       Thoracic back: She exhibits no tenderness.       Lumbar back: She exhibits no tenderness.  Lymphadenopathy:    She has no cervical adenopathy.  Neurological: She is alert and oriented to person, place, and  time. She has normal strength. Coordination and gait normal.  Skin: Skin is warm. No erythema.  Psychiatric: She has a normal mood and affect.  Well groomed, good eye contact.      ASSESSMENT AND PLAN:   Pablo LawrenceKaresha was seen today for follow-up.  Diagnoses and all orders for this visit:   Mild intermittent asthma without complication   Well controlled. No changes in current management. F/U in a year,before if needed.  -     albuterol (PROVENTIL HFA;VENTOLIN HFA) 108 (90 Base) MCG/ACT inhaler; Inhale 2 puffs into the lungs every 6 (six) hours as needed for wheezing or shortness of breath.  Screen for STD (sexually transmitted disease)  STD prevention education given. She wants test done in urine, planning on scheduling appt for her routine gyn preventive care. Pregnancy test negative.  -  Urine cytology ancillary only  URI, acute  Symptoms suggests a viral etiology.  Instructed to monitor for signs of complications, including new onset of fever among some, clearly instructed about warning signs. I also explained that cough and nasal congestion can last a few days and sometimes weeks. F/U as needed.   Gastroesophageal reflux disease without esophagitis  Stable. No changes in current management. Continue following with GI.Marland Kitchen  Generalized anxiety disorder  She will continue following with psychotherapists. Encouraged to establish with psychiatrists as well.     In regard to chronic pain she will continue following with pain clinic.    -Ms. Oluwadarasimi Favor was advised to return sooner than planned today if new concerns arise.       Leaner Morici G. [place], MD  [facility]. [place] office.

## 2016-09-13 NOTE — Patient Instructions (Addendum)
A few things to remember from today's visit:   Screen for STD (sexually transmitted disease) - Plan: Urine cytology ancillary only  Mild intermittent asthma without complication  URI, acute  Continue following with psychotherapists. If needed please establish with psychiatrists. ALWAYS condoms use. Please establish with gynecologists for your pap smear.We could also do it here.  Pregnancy test today negative. No changes in asthma treatment. Albuterol inh 2 puff every 6 hours for a week then as needed for wheezing or shortness of breath.   -viral infections are self-limited and we treat each symptom depending of severity.  Over the counter medications as decongestants and cold medications usually help, they need to be taken with caution if there is a history of high blood pressure or palpitations.  Plenty of fluids. Honey helps with cough. Steam inhalations helps with runny nose, nasal congestion, and may prevent sinus infections. Cough and nasal congestion could last a few days and sometimes weeks. Please follow in not any better in 1-2 weeks or if symptoms get worse.  Please be sure medication list is accurate. If a new problem present, please set up appointment sooner than planned today.

## 2016-09-14 ENCOUNTER — Telehealth: Payer: Self-pay | Admitting: Gastroenterology

## 2016-09-14 ENCOUNTER — Ambulatory Visit: Payer: Self-pay | Admitting: Gastroenterology

## 2016-09-14 LAB — URINE CYTOLOGY ANCILLARY ONLY
CHLAMYDIA, DNA PROBE: NEGATIVE
NEISSERIA GONORRHEA: NEGATIVE
Trichomonas: NEGATIVE

## 2016-09-14 NOTE — Telephone Encounter (Signed)
Appointment was moved to 09-27-16

## 2016-09-15 ENCOUNTER — Encounter: Payer: Self-pay | Admitting: Family Medicine

## 2016-09-27 ENCOUNTER — Ambulatory Visit (INDEPENDENT_AMBULATORY_CARE_PROVIDER_SITE_OTHER): Payer: Medicare Other | Admitting: Gastroenterology

## 2016-09-27 ENCOUNTER — Encounter: Payer: Self-pay | Admitting: Gastroenterology

## 2016-09-27 VITALS — BP 88/66 | HR 136 | Ht 66.0 in | Wt 91.0 lb

## 2016-09-27 DIAGNOSIS — E109 Type 1 diabetes mellitus without complications: Secondary | ICD-10-CM

## 2016-09-27 DIAGNOSIS — E1169 Type 2 diabetes mellitus with other specified complication: Secondary | ICD-10-CM

## 2016-09-27 DIAGNOSIS — K319 Disease of stomach and duodenum, unspecified: Secondary | ICD-10-CM | POA: Diagnosis not present

## 2016-09-27 DIAGNOSIS — R110 Nausea: Secondary | ICD-10-CM

## 2016-09-27 NOTE — Patient Instructions (Signed)
If you are age 27 or older, your body mass index should be between 23-30. Your Body mass index is 14.69 kg/m. If this is out of the aforementioned range listed, please consider follow up with your Primary Care Provider.  If you are age 27 or younger, your body mass index should be between 19-25. Your Body mass index is 14.69 kg/m. If this is out of the aformentioned range listed, please consider follow up with your Primary Care Provider.   Please follow up in six months.  Thank you for choosing me and [facility].  Dr. Ellwood DenseH. Danis

## 2016-09-27 NOTE — Progress Notes (Signed)
[facility] Progress Note  Chief Complaint: Nausea and diabetic gastropathy  Subjective  History:  Jo Pittman follows up for her diabetic strap with the which continues to be under good control on metoclopramide 5 mg. Some days she does not feel that she needs it at all, if she has worsening nausea she will take it 3 times for a day and thinks will settle down. She has not yet seen her endocrinologist since her October visit, when her hemoglobin A1c had improved from before.  She is most recently bothered by severe neck and back pain for which she is seen due to see a pain specialist. Her appetite is still poor, she says she has to force herself to eat. The chronic pain seems to be making this worse and is clearly driving up her heart rate today as she is visibly uncomfortable.  ROS: Cardiovascular:  no chest pain Respiratory: no dyspnea Chronic neck back and leg pain The patient's Past Medical, Family and Social History were reviewed and are on file in the EMR.  Objective:  Med list reviewed  Vital signs in last 24 hrs: Vitals:   09/27/16 0949  BP: (!) 88/66  Pulse: (!) 136   Her weight is back down from 95-91 pounds  Physical Exam  Very thin young woman with poor muscle mass She is visibly uncomfortable from pain  HEENT: sclera anicteric, oral mucosa moist without lesions  Neck: supple, no thyromegaly, JVD or lymphadenopathy  Cardiac: RRR without murmurs, S1S2 heard, no peripheral edema  Pulm: clear to auscultation bilaterally, normal RR and effort noted  Abdomen: soft, no tenderness, with active bowel sounds. No guarding or palpable hepatosplenomegaly. Insulin pump needle insertion point  right lower quadrant  Skin; warm and dry, no jaundice or rash   @ASSESSMENTPLANBEGIN @ Assessment: Encounter Diagnoses  Name Primary?  . Diabetic gastropathy (HCC) Yes  . Nausea without vomiting   . Type 1 diabetes mellitus without complication (HCC)     We reviewed the potential  side effect of metoclopramide, I am in favor with the way she currently doses it. Reviewed the medical dietary interventions for this. I hope that she has better pain control her appetite will improve.  Plan: Clinic visit in 6 months, call sooner as needed.   Total time 15 minutes, over half spent in counseling and coordination of care.   Charlie PitterHenry L Danis III

## 2016-09-28 ENCOUNTER — Encounter: Payer: Self-pay | Admitting: Family Medicine

## 2016-10-05 ENCOUNTER — Encounter: Payer: Medicare Other | Admitting: Family Medicine

## 2016-10-05 DIAGNOSIS — Z0289 Encounter for other administrative examinations: Secondary | ICD-10-CM

## 2016-11-17 ENCOUNTER — Encounter: Payer: Self-pay | Admitting: Family Medicine

## 2016-11-17 ENCOUNTER — Telehealth: Payer: Self-pay | Admitting: Family Medicine

## 2016-11-17 ENCOUNTER — Ambulatory Visit (INDEPENDENT_AMBULATORY_CARE_PROVIDER_SITE_OTHER): Payer: Medicaid Other | Admitting: Family Medicine

## 2016-11-17 ENCOUNTER — Ambulatory Visit (INDEPENDENT_AMBULATORY_CARE_PROVIDER_SITE_OTHER)
Admission: RE | Admit: 2016-11-17 | Discharge: 2016-11-17 | Disposition: A | Discharge status: Home / Self Care | Payer: Medicaid Other | Source: Ambulatory Visit | Attending: Family Medicine | Admitting: Family Medicine

## 2016-11-17 VITALS — BP 100/70 | HR 112 | Temp 100.4°F | Resp 12 | Ht 66.0 in | Wt 98.0 lb

## 2016-11-17 DIAGNOSIS — J988 Other specified respiratory disorders: Secondary | ICD-10-CM

## 2016-11-17 DIAGNOSIS — E109 Type 1 diabetes mellitus without complications: Secondary | ICD-10-CM

## 2016-11-17 DIAGNOSIS — K529 Noninfective gastroenteritis and colitis, unspecified: Secondary | ICD-10-CM | POA: Diagnosis not present

## 2016-11-17 DIAGNOSIS — J4521 Mild intermittent asthma with (acute) exacerbation: Secondary | ICD-10-CM | POA: Diagnosis not present

## 2016-11-17 DIAGNOSIS — R509 Fever, unspecified: Secondary | ICD-10-CM | POA: Diagnosis not present

## 2016-11-17 LAB — POC INFLUENZA A&B (BINAX/QUICKVUE)
INFLUENZA A, POC: NEGATIVE
Influenza B, POC: NEGATIVE

## 2016-11-17 MED ORDER — AZITHROMYCIN 250 MG PO TABS: ORAL_TABLET | ORAL | 0 refills | Status: AC

## 2016-11-17 MED ORDER — IPRATROPIUM-ALBUTEROL 0.5-2.5 (3) MG/3ML IN SOLN: 3.0000 mL | Freq: Once | RESPIRATORY_TRACT | Status: DC

## 2016-11-17 MED ORDER — IPRATROPIUM-ALBUTEROL 0.5-2.5 (3) MG/3ML IN SOLN
1.5000 mL | Freq: Once | RESPIRATORY_TRACT | Status: AC
  Administered 2016-11-17: 1.5 mL via RESPIRATORY_TRACT

## 2016-11-17 NOTE — Telephone Encounter (Signed)
Pt was seen today and would like a callback with her throat swab result

## 2016-11-17 NOTE — Telephone Encounter (Signed)
Called and gave patient results: flu test was negative.   Advised she needed to go have a chest x-ray, gave location, and advised she didn't need an appointment. Also advised that antibiotic was sent to pharmacy. Patient verbalized understanding.

## 2016-11-17 NOTE — Progress Notes (Signed)
HPI:  ACUTE VISIT  Chief Complaint  Patient presents with  . Sore Throat  . Chills    [name] is a 26 y.o.female here today complaining of 6 days of respiratory symptoms.  Nonproductive cough until this morning when she had small amount of clear sputum.She denies hemoptysis.  URI   This is a new problem. The current episode started in the past 7 days. The problem has been gradually worsening. The maximum temperature recorded prior to her arrival was 101 - 101.9 F. The fever has been present for 5 days or more. Associated symptoms include congestion, coughing, diarrhea, nausea, a sore throat, vomiting and wheezing. Pertinent negatives include no abdominal pain, chest pain, dysuria, ear pain, headaches, rash or sinus pain. She has tried NSAIDs for the symptoms. The treatment provided mild relief.    + Nasal congestion, rhinorrhea, mild sore throat, and post nasal drainage. + Fever,chills, and worsening body aches (Hx of chronic pain). C/O back pain upon coughing and with movement. Some SOB and wheezing.  + Diarrhea, nausea, and vomiting. Today she has had 2 loose stools, denies blood or mucus in stool.Yesterday she had about 12 loose stools.  Last time she vomited was yesterday, once. She has history of GERD and chronic nausea.  No Hx of recent travel. No sick contact. No known insect bite.  Hx of allergies: Allergic rhinitis and asthma. She has not used Albuterol,she is on Symbicort 80-4.5 mcg bid.  OTC medications for this problem: Took Ibuprofen today at 5:00 Am.She has taken Theraflu,Dyquil,Nyquil.  DM I: Some BS's in the 60's.   Review of Systems  Constitutional: Positive for appetite change, chills, fatigue and fever.  HENT: Positive for congestion, postnasal drip and sore throat. Negative for ear pain, mouth sores, sinus pain and voice change.   Eyes: Negative for redness and visual disturbance.  Respiratory: Positive for cough, shortness of breath  and wheezing.   Cardiovascular: Negative for chest pain and leg swelling.  Gastrointestinal: Positive for diarrhea, nausea and vomiting. Negative for abdominal pain and blood in stool.  Genitourinary: Negative for decreased urine volume, dysuria and hematuria.  Musculoskeletal: Positive for back pain and myalgias. Negative for joint swelling.  Skin: Negative for rash.  Allergic/Immunologic: Positive for environmental allergies.  Neurological: Negative for syncope, weakness and headaches.  Hematological: Negative for adenopathy. Does not bruise/bleed easily.  Psychiatric/Behavioral: Negative for confusion. The patient is nervous/anxious.       Current Outpatient Prescriptions on File Prior to Visit  Medication Sig Dispense Refill  . albuterol (PROVENTIL HFA;VENTOLIN HFA) 108 (90 Base) MCG/ACT inhaler Inhale 2 puffs into the lungs every 6 (six) hours as needed for wheezing or shortness of breath. 18 g 2  . budesonide-formoterol (SYMBICORT) 80-4.5 MCG/ACT inhaler Inhale 2 puffs into the lungs 2 (two) times daily. 3 Inhaler 3  . Insulin Human (INSULIN PUMP) SOLN Inject 1 each into the skin continuous. "Novolog"    . metoCLOPramide (REGLAN) 5 MG tablet Take 1 tablet (5 mg total) by mouth 3 (three) times daily before meals. 90 tablet 1  . omeprazole (PRILOSEC) 20 MG capsule Take 1 capsule (20 mg total) by mouth daily. 90 capsule 1  . Vitamin D, Ergocalciferol, (DRISDOL) 50000 units CAPS capsule Take 50,000 Units by mouth every Wednesday.      Current Facility-Administered Medications on File Prior to Visit  Medication Dose Route Frequency Provider Last Rate Last Dose  . 0.9 %  sodium chloride infusion  500 mL Intravenous  Continuous Sherrilyn Rist, MD         Past Medical History:  Diagnosis Date  . Back pain, chronic   . Diabetes mellitus without complication (HCC)    type I  . Sickle cell anemia (HCC)    HX of, no longer  . Stroke [facility]) 2008   R sided weakness/aneurism    Allergies  Allergen Reactions  . Vancomycin Itching and Rash    redman syndrome  . Carafate [Sucralfate] Itching  . Morphine And Related Itching    Itching when given IV     Social History   Social History  . Marital status: Single    Spouse name: N/A  . Number of children: N/A  . Years of education: N/A   Social History Main Topics  . Smoking status: Former Smoker    Types: Cigarettes    Quit date: 05/16/2015  . Smokeless tobacco: Never Used  . Alcohol use 2.4 oz/week    4 Shots of liquor per week     Comment: occ  . Drug use: Yes    Types: Marijuana  . Sexual activity: Not Asked   Other Topics Concern  . None   Social History Narrative  . None    Vitals:   11/17/16 1053  BP: 100/70  Pulse: (!) 112  Resp: 12  Temp: (!) 100.4 F (38 C)  O2 sat 95% at RA. Body mass index is 15.82 kg/m.   Physical Exam  Nursing note and vitals reviewed. Constitutional: She is oriented to person, place, and time. She appears well-developed. No distress.  HENT:  Head: Atraumatic.  Right Ear: Tympanic membrane, external ear and ear canal normal.  Left Ear: Tympanic membrane, external ear and ear canal normal.  Nose: Rhinorrhea present.  Mouth/Throat: Uvula is midline and mucous membranes are normal. Posterior oropharyngeal erythema (mild) present. No oropharyngeal exudate or posterior oropharyngeal edema.  Eyes: Conjunctivae and EOM are normal.  Cardiovascular: Regular rhythm.  Tachycardia present.   No murmur heard. Respiratory: Effort normal. No stridor. No respiratory distress. She has wheezes (mild). She has no rales.  GI: Bowel sounds are normal. She exhibits no mass. There is no hepatomegaly. There is no tenderness.  Musculoskeletal: She exhibits no edema.  Lymphadenopathy:       Head (right side): No submandibular adenopathy present.       Head (left side): No submandibular adenopathy present.    She has cervical adenopathy.       Right cervical: Posterior  cervical adenopathy present.       Left cervical: Posterior cervical adenopathy present.  Neurological: She is alert and oriented to person, place, and time. She has normal strength. Coordination and gait normal.  Skin: Skin is warm. No rash noted. No erythema.  Psychiatric: Her speech is normal. Her mood appears anxious. Her affect is blunt.  Well groomed, poor eye contact.    ASSESSMENT AND PLAN:   Anayely was seen today for sore throat and chills.  Diagnoses and all orders for this visit:  Fever, unspecified fever cause  We discussed possible etiologies, rapid flu test was negative. She can continue Tylenol and/or NSAID's OTC to manage symptoms.  -     POC Influenza A&B(BINAX/QUICKVUE) -     DG Chest 2 View; Future  Exacerbation of intermittent asthma, unspecified asthma severity  She left before re-evaluating lungs after neb treatment. I will be recommending Albuterol inh 2 puff every 6 hours for a week then as needed for wheezing  or shortness of breath.  Also to continue Symbicort.  -     ipratropium-albuterol (DUONEB) 0.5-2.5 (3) MG/3ML nebulizer solution 1.5 mL; Take 1.5 mLs by nebulization once. -     DG Chest 2 View; Future  Respiratory tract infection  Because history of DM 1 and persistent fever I decided to treat with antibiotics. Before leaving I explain this could be viral and therefore abx will not help.  She was contacted by phone to let her know there is a CXR order and Rx at her pharmacy.   -     azithromycin (ZITHROMAX) 250 MG tablet; 2 tabs today then continue 1 tab daily for 4 days. -     DG Chest 2 View; Future  Gastroenteritis, acute  Along with respiratory symptoms, ? viral etiology.  Abx could aggravate GI symptoms. She has history of GERD and nausea with vomiting, she denies history of diarrhea. Adequate hydration encouraged.  Type 1 diabetes mellitus without complication (HCC)  Decreased appetite because acute illness + vomiting,  increasing the risk of hypoglycemic events. She is on insulin pump and following with endocrinologists.   Right after completing neb treatment she left while I was with another pt.I was planning on re-evaluating and discussing final plan,which included CBC and BMP.      Christana Angelica G. [place], MD  [facility]. [place] office.

## 2016-11-17 NOTE — Progress Notes (Signed)
Pre visit review using our clinic review tool, if applicable. No additional management support is needed unless otherwise documented below in the visit note.   Patient left before being seen by the provider again after nebulizer treatment, saying that she couldn't wait any longer to be seen again.

## 2016-11-18 ENCOUNTER — Encounter: Payer: Self-pay | Admitting: Family Medicine

## 2016-11-22 ENCOUNTER — Encounter: Payer: Self-pay | Admitting: Family Medicine

## 2016-11-30 ENCOUNTER — Encounter: Payer: Medicaid Other | Admitting: Family Medicine

## 2016-12-09 ENCOUNTER — Encounter: Payer: Self-pay | Admitting: Family Medicine

## 2016-12-09 NOTE — Progress Notes (Deleted)
HPI:   [name] is a 26 y.o. female, who is here today for her routine gynecologic examination and to address some concerns. .   Regular exercise 3 or more time per week: *** Following a healthy diet: *** She lives with ***  Chronic medical problems: Chronic pain, depression, asthma, and allergic rhinitis.  Pap smear **** Hx of abnormal pap smears: *** Hx of STD's ***   She has *** concerns today.   Review of Systems    Current Outpatient Prescriptions on File Prior to Visit  Medication Sig Dispense Refill  . albuterol (PROVENTIL HFA;VENTOLIN HFA) 108 (90 Base) MCG/ACT inhaler Inhale 2 puffs into the lungs every 6 (six) hours as needed for wheezing or shortness of breath. 18 g 2  . budesonide-formoterol (SYMBICORT) 80-4.5 MCG/ACT inhaler Inhale 2 puffs into the lungs 2 (two) times daily. 3 Inhaler 3  . cyclobenzaprine (FLEXERIL) 10 MG tablet Take 10 mg by mouth 3 (three) times daily as needed for muscle spasms.    . Insulin Human (INSULIN PUMP) SOLN Inject 1 each into the skin continuous. "Novolog"    . metoCLOPramide (REGLAN) 5 MG tablet Take 1 tablet (5 mg total) by mouth 3 (three) times daily before meals. 90 tablet 1  . omeprazole (PRILOSEC) 20 MG capsule Take 1 capsule (20 mg total) by mouth daily. 90 capsule 1  . oxyCODONE-acetaminophen (PERCOCET/ROXICET) 5-325 MG tablet Take 1 tablet by mouth every 6 (six) hours as needed for severe pain.    . Vitamin D, Ergocalciferol, (DRISDOL) 50000 units CAPS capsule Take 50,000 Units by mouth every Wednesday.      Current Facility-Administered Medications on File Prior to Visit  Medication Dose Route Frequency Provider Last Rate Last Dose  . 0.9 %  sodium chloride infusion  500 mL Intravenous Continuous Sherrilyn Rist, MD         Past Medical History:  Diagnosis Date  . Back pain, chronic   . Diabetes mellitus without complication (HCC)    type I  . Sickle cell anemia (HCC)    HX of, no longer  .  Stroke [facility]) 2008   R sided weakness/aneurism    Allergies  Allergen Reactions  . Vancomycin Itching and Rash    redman syndrome  . Carafate [Sucralfate] Itching  . Morphine And Related Itching    Itching when given IV     Family History  Problem Relation Age of Onset  . Cancer Paternal Aunt   . Cancer Paternal Grandmother   . Stroke Paternal Grandmother   . Colon cancer Neg Hx   . Esophageal cancer Neg Hx   . Pancreatic cancer Neg Hx   . Rectal cancer Neg Hx   . Stomach cancer Neg Hx     Social History   Social History  . Marital status: Single    Spouse name: N/A  . Number of children: N/A  . Years of education: N/A   Social History Main Topics  . Smoking status: Former Smoker    Types: Cigarettes    Quit date: 05/16/2015  . Smokeless tobacco: Never Used  . Alcohol use 2.4 oz/week    4 Shots of liquor per week     Comment: occ  . Drug use: Yes    Types: Marijuana  . Sexual activity: Not on file   Other Topics Concern  . Not on file   Social History Narrative  . No narrative on file   There were no  vitals filed for this visit. There is no height or weight on file to calculate BMI. O2 sat at RA ***  Wt Readings from Last 3 Encounters:  11/17/16 98 lb (44.5 kg)  09/27/16 91 lb (41.3 kg)  09/13/16 95 lb 8 oz (43.3 kg)    Physical Exam    ASSESSMENT AND PLAN:    There are no diagnoses linked to this encounter.       No Follow-up on file.       Merrill Deanda G. [place], MD  [facility]. [place] office.

## 2016-12-10 ENCOUNTER — Encounter: Payer: Medicaid Other | Admitting: Family Medicine

## 2016-12-27 ENCOUNTER — Telehealth: Payer: Self-pay

## 2016-12-27 NOTE — Telephone Encounter (Signed)
PA for Symbicort approved, pharmacy aware.

## 2017-01-25 NOTE — Progress Notes (Signed)
HPI:   Jo Pittman is a 26 y.o. female, who is here today for her gynecologic routine examination.   Immunization History  Administered Date(s) Administered  . Influenza,inj,Quad PF,36+ Mos 05/14/2016  . Tdap 08/16/2009     Review of Systems    Current Outpatient Prescriptions on File Prior to Visit  Medication Sig Dispense Refill  . albuterol (PROVENTIL HFA;VENTOLIN HFA) 108 (90 Base) MCG/ACT inhaler Inhale 2 puffs into the lungs every 6 (six) hours as needed for wheezing or shortness of breath. 18 g 2  . budesonide-formoterol (SYMBICORT) 80-4.5 MCG/ACT inhaler Inhale 2 puffs into the lungs 2 (two) times daily. 3 Inhaler 3  . cyclobenzaprine (FLEXERIL) 10 MG tablet Take 10 mg by mouth 3 (three) times daily as needed for muscle spasms.    . Insulin Human (INSULIN PUMP) SOLN Inject 1 each into the skin continuous. "Novolog"    . metoCLOPramide (REGLAN) 5 MG tablet Take 1 tablet (5 mg total) by mouth 3 (three) times daily before meals. 90 tablet 1  . omeprazole (PRILOSEC) 20 MG capsule Take 1 capsule (20 mg total) by mouth daily. 90 capsule 1  . oxyCODONE-acetaminophen (PERCOCET/ROXICET) 5-325 MG tablet Take 1 tablet by mouth every 6 (six) hours as needed for severe pain.    . Vitamin D, Ergocalciferol, (DRISDOL) 50000 units CAPS capsule Take 50,000 Units by mouth every Wednesday.      Current Facility-Administered Medications on File Prior to Visit  Medication Dose Route Frequency Provider Last Rate Last Dose  . 0.9 %  sodium chloride infusion  500 mL Intravenous Continuous Sherrilyn Rist, MD         Past Medical History:  Diagnosis Date  . Back pain, chronic   . Diabetes mellitus without complication (HCC)    type I  . Sickle cell anemia (HCC)    HX of, no longer  . Stroke [facility]) 2008   R sided weakness/aneurism    Allergies  Allergen Reactions  . Vancomycin Itching and Rash    redman syndrome  . Carafate [Sucralfate] Itching  . Morphine And  Related Itching    Itching when given IV     Family History  Problem Relation Age of Onset  . Cancer Paternal Aunt   . Cancer Paternal Grandmother   . Stroke Paternal Grandmother   . Colon cancer Neg Hx   . Esophageal cancer Neg Hx   . Pancreatic cancer Neg Hx   . Rectal cancer Neg Hx   . Stomach cancer Neg Hx     Social History   Social History  . Marital status: Single    Spouse name: N/A  . Number of children: N/A  . Years of education: N/A   Social History Main Topics  . Smoking status: Former Smoker    Types: Cigarettes    Quit date: 05/16/2015  . Smokeless tobacco: Never Used  . Alcohol use 2.4 oz/week    4 Shots of liquor per week     Comment: occ  . Drug use: Yes    Types: Marijuana  . Sexual activity: Not on file   Other Topics Concern  . Not on file   Social History Narrative  . No narrative on file     There were no vitals filed for this visit. There is no height or weight on file to calculate BMI.    Wt Readings from Last 3 Encounters:  11/17/16 98 lb (44.5 kg)  09/27/16 91 lb (41.3  kg)  09/13/16 95 lb 8 oz (43.3 kg)          Physical Exam    ASSESSMENT AND PLAN:      There are no diagnoses linked to this encounter.           No Follow-up on file.          Ronrico Dupin G. [place], MD  [facility]. [place] office.

## 2017-01-26 ENCOUNTER — Telehealth: Payer: Self-pay | Admitting: Family Medicine

## 2017-01-26 ENCOUNTER — Encounter: Payer: Medicaid Other | Admitting: Family Medicine

## 2017-01-26 ENCOUNTER — Encounter: Payer: Self-pay | Admitting: Family Medicine

## 2017-01-26 NOTE — Telephone Encounter (Signed)
Patient presented late for her appt after having 4 No Show appointments previously.  [facility] which we advised we do not accept.  Patient became very upset and began demonstrating aggressive behavior including calling the front staff "Stupid white bitches".

## 2017-02-08 ENCOUNTER — Other Ambulatory Visit: Payer: Self-pay | Admitting: Family Medicine

## 2017-02-08 DIAGNOSIS — K219 Gastro-esophageal reflux disease without esophagitis: Secondary | ICD-10-CM

## 2017-03-02 ENCOUNTER — Encounter: Payer: Self-pay | Admitting: Gastroenterology

## 2017-05-05 ENCOUNTER — Encounter: Payer: Self-pay | Admitting: Family Medicine

## 2017-11-12 IMAGING — MR MR HEAD W/O CM
9 of 10 series · 36 of 48 positions shown · non-contrast
Comparison: 12/08/2015 head CT.  No comparison brain MR.

CLINICAL DATA: 25-year-old female with sickle cell disease treated
with bone marrow transplant presenting with left-sided weakness.
Subsequent encounter.

EXAM:
MRI HEAD WITHOUT CONTRAST
TECHNIQUE: Multiplanar, multiecho pulse sequences of the brain and surrounding
structures were obtained without intravenous contrast.

[Series 3: DWI · axial · 3.0mm · 1.09mm/px · z∈[-68,+70]mm · 9 of 94 slices shown (1 of 4)]
[im 1/94]
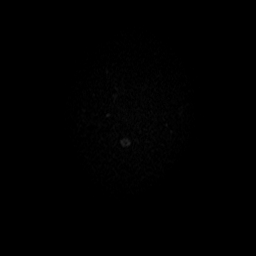
[im 12/94]
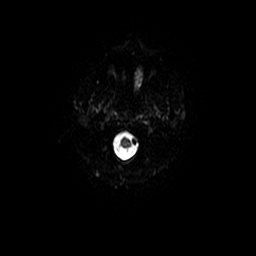
[im 24/94]
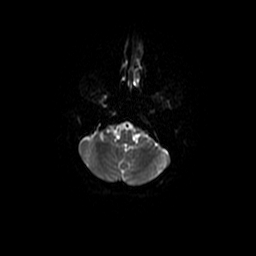
[im 35/94]
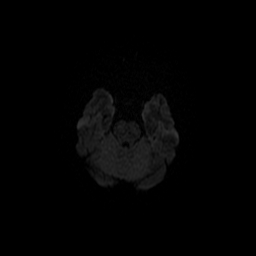
[im 47/94]
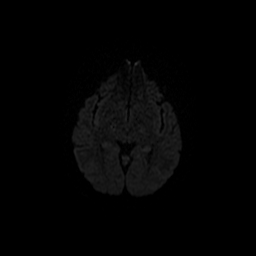
[im 59/94]
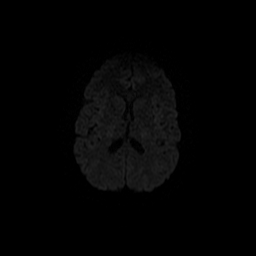
[im 70/94]
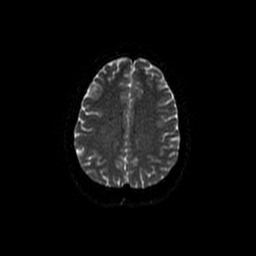
[im 82/94]
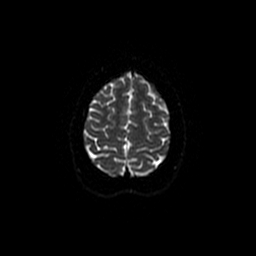
[im 94/94]
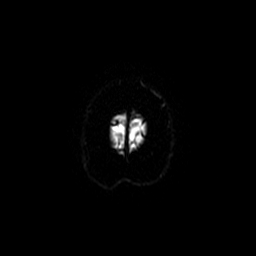

[Series 4: DWI · coronal · 5.0mm · 1.09mm/px · 7 of 66 slices shown (2 of 4)]
[im 1/66]
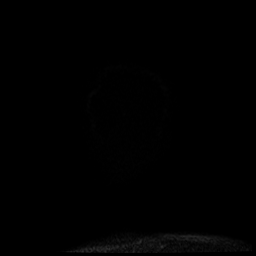
[im 11/66]
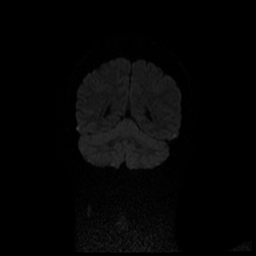
[im 22/66]
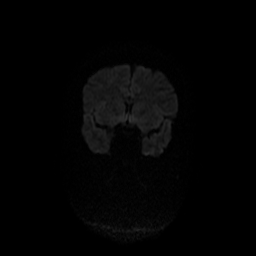
[im 33/66]
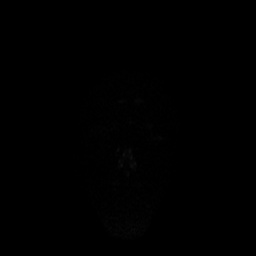
[im 44/66]
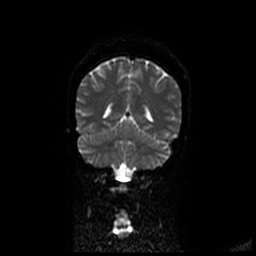
[im 55/66]
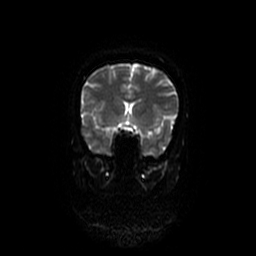
[im 66/66]
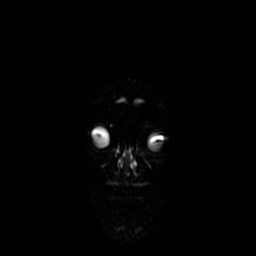

[Series 5: T2 · axial · 5.0mm · 0.43mm/px · z∈[-65,+72]mm · 3 of 24 slices shown (1 of 2)]
[im 1/24]
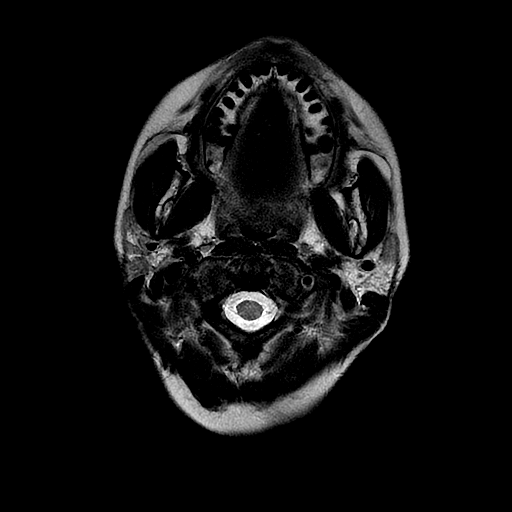
[im 12/24]
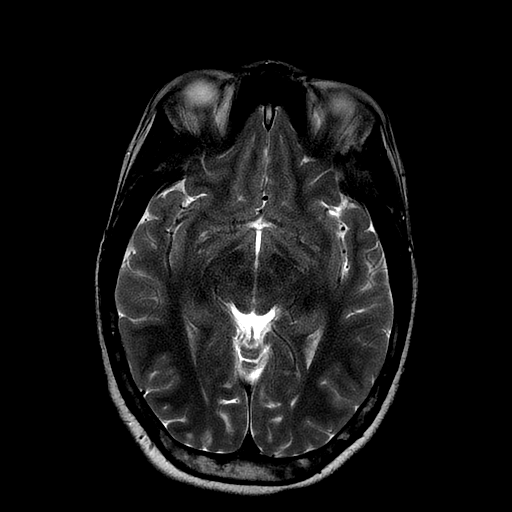
[im 24/24]
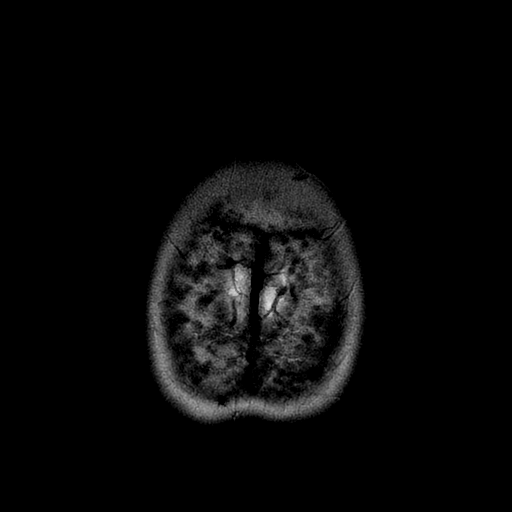

[Series 6: FLAIR · axial · 5.0mm · 0.43mm/px · z∈[-65,+72]mm · 3 of 24 slices shown]
[im 1/24]
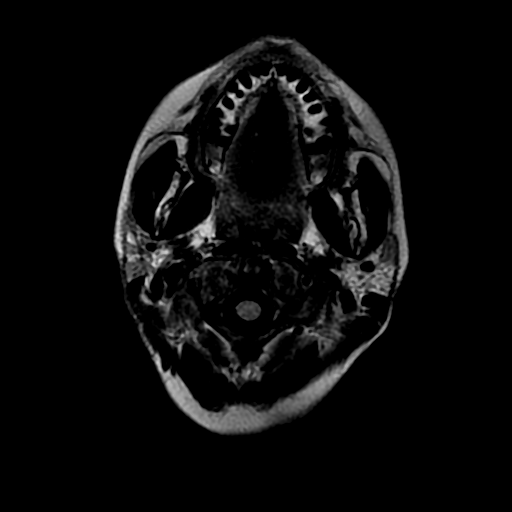
[im 12/24]
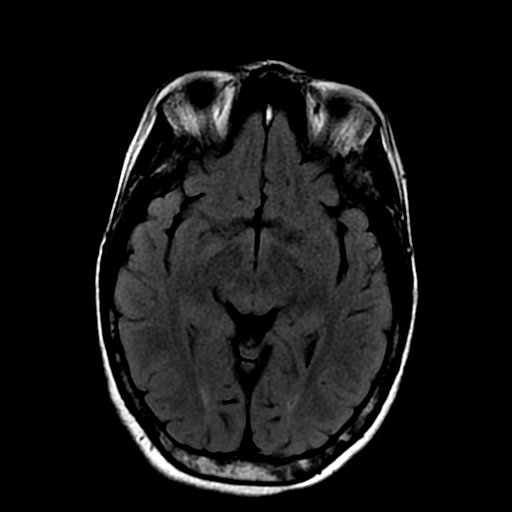
[im 24/24]
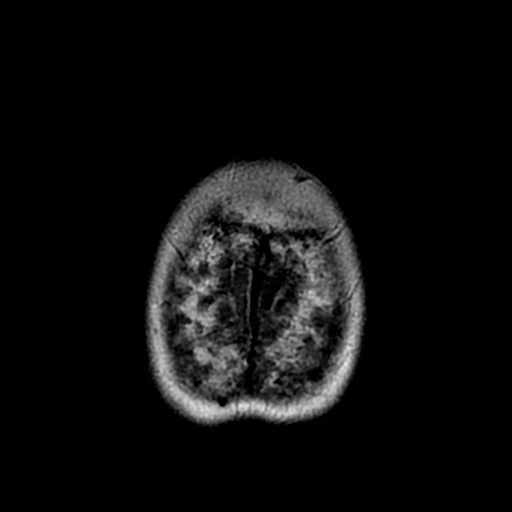

[Series 7: T1 · sagittal · 5.0mm · 0.47mm/px · 2 of 23 slices shown]
[im 1/23]
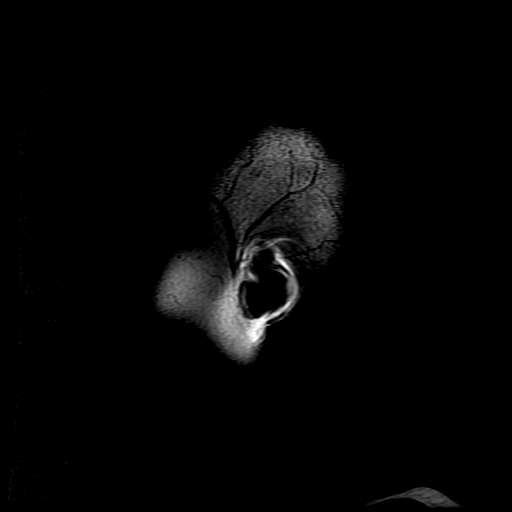
[im 23/23]
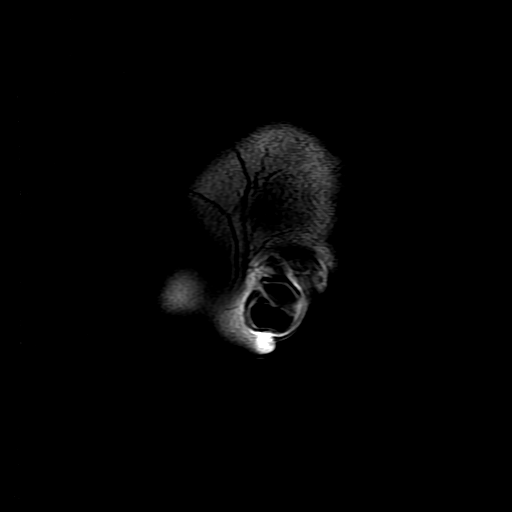

[Series 8: ax mpgr · axial · 5.0mm · 0.43mm/px · 1 of 24 slices shown]
[im 1/24]
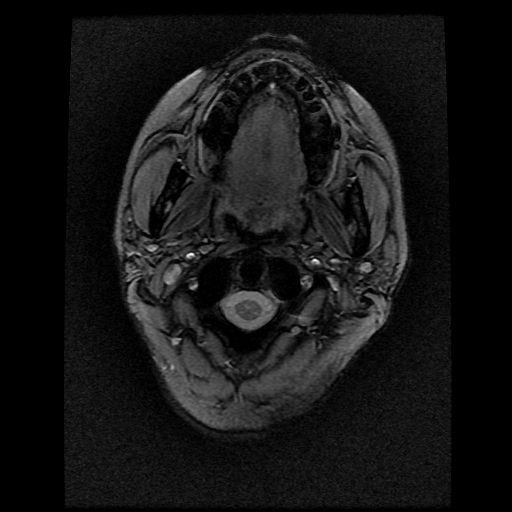

[Series 10: T2 · coronal · 5.0mm · 0.43mm/px · 3 of 28 slices shown (2 of 2)]
[im 1/28]
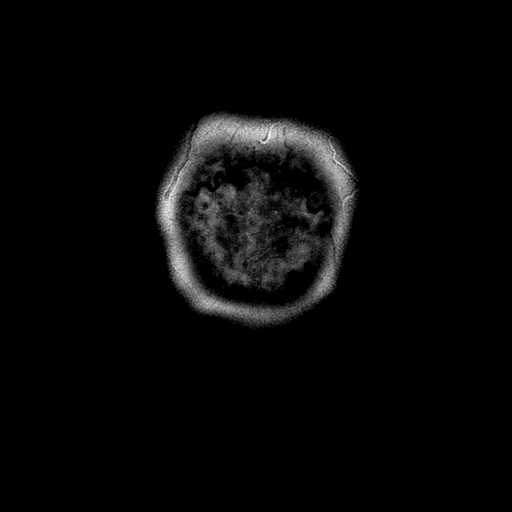
[im 14/28]
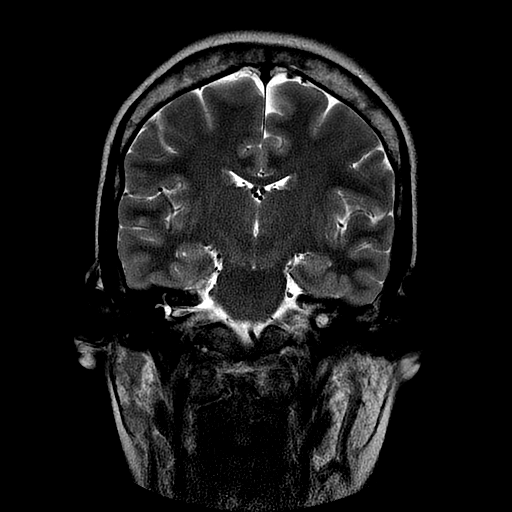
[im 28/28]
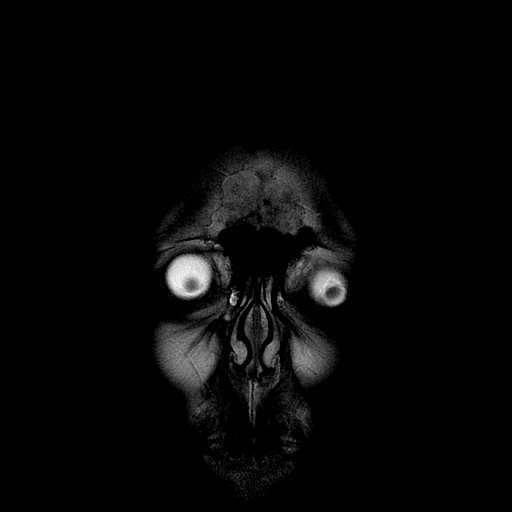

[Series 300: DWI · axial · 3.0mm · 1.09mm/px · z∈[-68,+70]mm · 5 of 47 slices shown (3 of 4)]
[im 1/47]
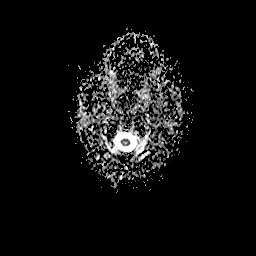
[im 12/47]
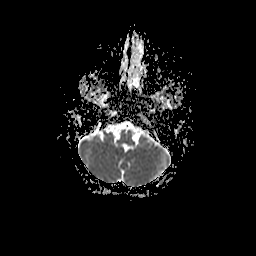
[im 24/47]
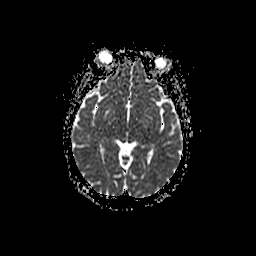
[im 35/47]
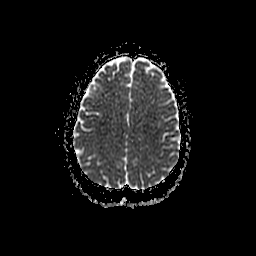
[im 47/47]
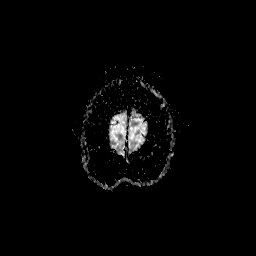

[Series 400: DWI · coronal · 5.0mm · 1.09mm/px · 3 of 33 slices shown (4 of 4)]
[im 1/33]
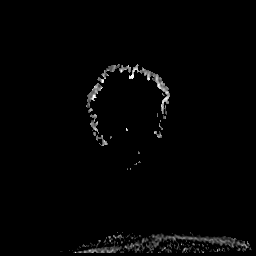
[im 17/33]
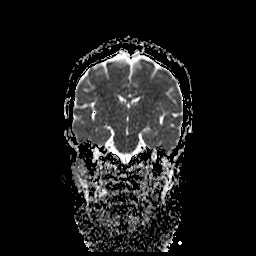
[im 33/33]
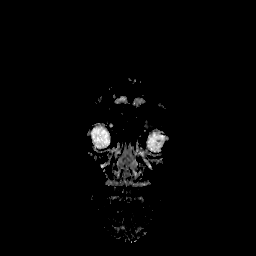

[36 of 48 positions shown; findings below may reference images not displayed]

FINDINGS: No acute infarct or intracranial hemorrhage.

Right vertebral artery is not visualized. Remainder of major
intracranial vascular structures are patent.

Diffuse bone marrow abnormality consistent with patient's history of
sickle cell disease.

No hydrocephalus.

No intracranial mass lesion noted on this unenhanced exam.

Minimal exophthalmos.

Cervical medullary junction unremarkable.

Pituitary region appears small on sagittal imaging but of normal
size on coronal imaging.
IMPRESSION: No acute infarct.

Diffuse bone marrow changes consistent with patient's history of
sickle cell disease.

Right vertebral artery not visualized.

Images reviewed with Dr. Rosee 12/08/2015 [DATE] p.m.

## 2019-10-16 ENCOUNTER — Telehealth: Payer: Self-pay | Admitting: Physician Assistant

## 2019-10-16 ENCOUNTER — Ambulatory Visit (HOSPITAL_COMMUNITY): Payer: Medicare Other

## 2019-10-16 ENCOUNTER — Other Ambulatory Visit: Payer: Self-pay | Admitting: Physician Assistant

## 2019-10-16 ENCOUNTER — Ambulatory Visit (HOSPITAL_COMMUNITY): Payer: Self-pay

## 2019-10-16 DIAGNOSIS — U071 COVID-19: Secondary | ICD-10-CM

## 2019-10-16 DIAGNOSIS — E109 Type 1 diabetes mellitus without complications: Secondary | ICD-10-CM

## 2019-10-16 NOTE — Telephone Encounter (Signed)
Jo Pittman messaged me back through MyChart saying that she thinks her symptoms actually started 2/19. She had a headache that she previously had not counted as sympotoms. that would make today day 11 and she is concerned and wants to cancel her monoclonal antibody which was scheduled for today.  Cline Crock PA-C  [facility]

## 2019-10-16 NOTE — Telephone Encounter (Signed)
Jo Pittman is not longer under my care. She is seeing Misty Stanley, [place]. Thanks, BJ

## 2019-10-16 NOTE — Progress Notes (Signed)
  I connected by phone with Jo Pittman on 10/16/2019 at 9:16 AM to discuss the potential use of an new treatment for mild to moderate COVID-19 viral infection in non-hospitalized patients.  This patient is a 29 y.o. female that meets the FDA criteria for Emergency Use Authorization of bamlanivimab or casirivimab\imdevimab.  Has a (+) direct SARS-CoV-2 viral test result  Has mild or moderate COVID-19   Is ? 30 years of age and weighs ? 40 kg  Is NOT hospitalized due to COVID-19  Is NOT requiring oxygen therapy or requiring an increase in baseline oxygen flow rate due to COVID-19  Is within 10 days of symptom onset  Has at least one of the high risk factor(s) for progression to severe COVID-19 and/or hospitalization as defined in EUA.  Specific high risk criteria : Diabetes   I have spoken and communicated the following to the patient or parent/caregiver:  1. FDA has authorized the emergency use of bamlanivimab and casirivimab\imdevimab for the treatment of mild to moderate COVID-19 in adults and pediatric patients with positive results of direct SARS-CoV-2 viral testing who are 8 years of age and older weighing at least 40 kg, and who are at high risk for progressing to severe COVID-19 and/or hospitalization.  2. The significant known and potential risks and benefits of bamlanivimab and casirivimab\imdevimab, and the extent to which such potential risks and benefits are unknown.  3. Information on available alternative treatments and the risks and benefits of those alternatives, including clinical trials.  4. Patients treated with bamlanivimab and casirivimab\imdevimab should continue to self-isolate and use infection control measures (e.g., wear mask, isolate, social distance, avoid sharing personal items, clean and disinfect "high touch" surfaces, and frequent handwashing) according to CDC guidelines.   5. The patient or parent/caregiver has the option to accept or refuse  bamlanivimab or casirivimab\imdevimab .  After reviewing this information with the patient, The patient agreed to proceed with receiving the bamlanimivab infusion and will be provided a copy of the Fact sheet prior to receiving the infusion.   Sx onset 2/21. Tested at [facility]. Scheduled for today at 12:30pm.   Cline Crock 10/16/2019 9:16 AM   CC: Dr. [place]

## 2020-12-29 ENCOUNTER — Encounter (HOSPITAL_COMMUNITY): Payer: Self-pay

## 2020-12-29 ENCOUNTER — Emergency Department (HOSPITAL_COMMUNITY): Payer: Medicare Other | Admitting: Anesthesiology

## 2020-12-29 ENCOUNTER — Encounter (HOSPITAL_COMMUNITY)
Admission: EM | Disposition: A | Discharge status: Home / Self Care | Payer: Self-pay | Source: Home / Self Care | Attending: Emergency Medicine

## 2020-12-29 ENCOUNTER — Ambulatory Visit (HOSPITAL_COMMUNITY)
Admission: EM | Admit: 2020-12-29 | Discharge: 2020-12-29 | Disposition: A | Discharge status: Home / Self Care | Payer: Medicare Other

## 2020-12-29 ENCOUNTER — Ambulatory Visit (HOSPITAL_COMMUNITY)
Admission: EM | Admit: 2020-12-29 | Discharge: 2020-12-30 | Disposition: A | Discharge status: Home / Self Care | Payer: Medicare Other | Attending: Emergency Medicine | Admitting: Emergency Medicine

## 2020-12-29 ENCOUNTER — Other Ambulatory Visit: Payer: Self-pay

## 2020-12-29 DIAGNOSIS — Z881 Allergy status to other antibiotic agents status: Secondary | ICD-10-CM | POA: Insufficient documentation

## 2020-12-29 DIAGNOSIS — M65841 Other synovitis and tenosynovitis, right hand: Secondary | ICD-10-CM | POA: Diagnosis not present

## 2020-12-29 DIAGNOSIS — O98812 Other maternal infectious and parasitic diseases complicating pregnancy, second trimester: Secondary | ICD-10-CM | POA: Insufficient documentation

## 2020-12-29 DIAGNOSIS — Z3A26 26 weeks gestation of pregnancy: Secondary | ICD-10-CM

## 2020-12-29 DIAGNOSIS — W540XXD Bitten by dog, subsequent encounter: Secondary | ICD-10-CM

## 2020-12-29 DIAGNOSIS — E1069 Type 1 diabetes mellitus with other specified complication: Secondary | ICD-10-CM | POA: Diagnosis not present

## 2020-12-29 DIAGNOSIS — Z20822 Contact with and (suspected) exposure to covid-19: Secondary | ICD-10-CM | POA: Insufficient documentation

## 2020-12-29 DIAGNOSIS — Z7951 Long term (current) use of inhaled steroids: Secondary | ICD-10-CM | POA: Diagnosis not present

## 2020-12-29 DIAGNOSIS — Z79899 Other long term (current) drug therapy: Secondary | ICD-10-CM | POA: Diagnosis not present

## 2020-12-29 DIAGNOSIS — O24012 Pre-existing diabetes mellitus, type 1, in pregnancy, second trimester: Secondary | ICD-10-CM

## 2020-12-29 DIAGNOSIS — M7989 Other specified soft tissue disorders: Secondary | ICD-10-CM | POA: Diagnosis not present

## 2020-12-29 DIAGNOSIS — Z794 Long term (current) use of insulin: Secondary | ICD-10-CM | POA: Insufficient documentation

## 2020-12-29 DIAGNOSIS — Z9481 Bone marrow transplant status: Secondary | ICD-10-CM | POA: Insufficient documentation

## 2020-12-29 DIAGNOSIS — Z885 Allergy status to narcotic agent status: Secondary | ICD-10-CM | POA: Insufficient documentation

## 2020-12-29 DIAGNOSIS — L089 Local infection of the skin and subcutaneous tissue, unspecified: Secondary | ICD-10-CM

## 2020-12-29 DIAGNOSIS — Z888 Allergy status to other drugs, medicaments and biological substances status: Secondary | ICD-10-CM | POA: Insufficient documentation

## 2020-12-29 DIAGNOSIS — Z87891 Personal history of nicotine dependence: Secondary | ICD-10-CM | POA: Diagnosis not present

## 2020-12-29 DIAGNOSIS — Z3A27 27 weeks gestation of pregnancy: Secondary | ICD-10-CM | POA: Insufficient documentation

## 2020-12-29 DIAGNOSIS — O9A212 Injury, poisoning and certain other consequences of external causes complicating pregnancy, second trimester: Secondary | ICD-10-CM

## 2020-12-29 HISTORY — PX: I & D EXTREMITY: SHX5045

## 2020-12-29 LAB — CBC WITH DIFFERENTIAL/PLATELET
Abs Immature Granulocytes: 0.24 10*3/uL — ABNORMAL HIGH (ref 0.00–0.07)
Basophils Absolute: 0 10*3/uL (ref 0.0–0.1)
Basophils Relative: 0 %
Eosinophils Absolute: 0.1 10*3/uL (ref 0.0–0.5)
Eosinophils Relative: 0 %
HCT: 24.4 % — ABNORMAL LOW (ref 36.0–46.0)
Hemoglobin: 8.4 g/dL — ABNORMAL LOW (ref 12.0–15.0)
Immature Granulocytes: 2 %
Lymphocytes Relative: 24 %
Lymphs Abs: 3.4 10*3/uL (ref 0.7–4.0)
MCH: 37.5 pg — ABNORMAL HIGH (ref 26.0–34.0)
MCHC: 34.4 g/dL (ref 30.0–36.0)
MCV: 108.9 fL — ABNORMAL HIGH (ref 80.0–100.0)
Monocytes Absolute: 1.5 10*3/uL — ABNORMAL HIGH (ref 0.1–1.0)
Monocytes Relative: 10 %
Neutro Abs: 9.1 10*3/uL — ABNORMAL HIGH (ref 1.7–7.7)
Neutrophils Relative %: 64 %
Platelets: 209 10*3/uL (ref 150–400)
RBC: 2.24 MIL/uL — ABNORMAL LOW (ref 3.87–5.11)
RDW: 12.5 % (ref 11.5–15.5)
WBC: 14.3 10*3/uL — ABNORMAL HIGH (ref 4.0–10.5)
nRBC: 0 % (ref 0.0–0.2)

## 2020-12-29 LAB — BASIC METABOLIC PANEL
Anion gap: 8 (ref 5–15)
BUN: 6 mg/dL (ref 6–20)
CO2: 21 mmol/L — ABNORMAL LOW (ref 22–32)
Calcium: 8.6 mg/dL — ABNORMAL LOW (ref 8.9–10.3)
Chloride: 105 mmol/L (ref 98–111)
Creatinine, Ser: 0.67 mg/dL (ref 0.44–1.00)
GFR, Estimated: >60 mL/min (ref >60)
Glucose, Bld: 99 mg/dL (ref 70–99)
Potassium: 3.3 mmol/L — ABNORMAL LOW (ref 3.5–5.1)
Sodium: 134 mmol/L — ABNORMAL LOW (ref 135–145)

## 2020-12-29 LAB — I-STAT BETA HCG BLOOD, ED (MC, WL, AP ONLY): I-stat hCG, quantitative: >2000 m[IU]/mL — ABNORMAL HIGH (ref <5)

## 2020-12-29 LAB — RESP PANEL BY RT-PCR (FLU A&B, COVID) ARPGX2
Influenza A by PCR: NEGATIVE
Influenza B by PCR: NEGATIVE
SARS Coronavirus 2 by RT PCR: NEGATIVE

## 2020-12-29 LAB — GLUCOSE, CAPILLARY: Glucose-Capillary: 81 mg/dL (ref 70–99)

## 2020-12-29 SURGERY — IRRIGATION AND DEBRIDEMENT EXTREMITY
Anesthesia: Monitor Anesthesia Care | Site: Hand | Laterality: Right

## 2020-12-29 MED ORDER — CEPHALEXIN 500 MG PO CAPS: 500.0000 mg | ORAL_CAPSULE | Freq: Four times a day (QID) | ORAL | 0 refills | Status: AC

## 2020-12-29 MED ORDER — FENTANYL CITRATE (PF) 250 MCG/5ML IJ SOLN
INTRAMUSCULAR | Status: DC | PRN
  Administered 2020-12-29 (×2): 50 ug via INTRAVENOUS

## 2020-12-29 MED ORDER — PROPOFOL 10 MG/ML IV BOLUS
INTRAVENOUS | Status: AC
  Filled 2020-12-29: qty 40

## 2020-12-29 MED ORDER — FENTANYL CITRATE (PF) 250 MCG/5ML IJ SOLN
INTRAMUSCULAR | Status: AC
  Filled 2020-12-29: qty 5

## 2020-12-29 MED ORDER — LACTATED RINGERS IV SOLN: INTRAVENOUS | Status: DC | PRN

## 2020-12-29 MED ORDER — FENTANYL CITRATE (PF) 100 MCG/2ML IJ SOLN
25.0000 ug | INTRAMUSCULAR | Status: DC | PRN
Start: 2020-12-29 — End: 2020-12-30

## 2020-12-29 MED ORDER — ROPIVACAINE HCL 5 MG/ML IJ SOLN
INTRAMUSCULAR | Status: DC | PRN
  Administered 2020-12-29: 30 mL via PERINEURAL

## 2020-12-29 MED ORDER — CEFAZOLIN SODIUM-DEXTROSE 2-3 GM-%(50ML) IV SOLR
INTRAVENOUS | Status: DC | PRN
  Administered 2020-12-29: 2 g via INTRAVENOUS

## 2020-12-29 MED ORDER — 0.9 % SODIUM CHLORIDE (POUR BTL) OPTIME
TOPICAL | Status: DC | PRN
  Administered 2020-12-29: 1000 mL

## 2020-12-29 SURGICAL SUPPLY — 43 items
BAG DECANTER FOR FLEXI CONT (MISCELLANEOUS) IMPLANT
BNDG ELASTIC 2 VLCR STRL LF (GAUZE/BANDAGES/DRESSINGS) ×2 IMPLANT
BNDG ELASTIC 3X5.8 VLCR STR LF (GAUZE/BANDAGES/DRESSINGS) IMPLANT
BNDG ELASTIC 4X5.8 VLCR STR LF (GAUZE/BANDAGES/DRESSINGS) IMPLANT
BNDG GAUZE ELAST 4 BULKY (GAUZE/BANDAGES/DRESSINGS) IMPLANT
BNDG STRETCH 4X75 NS LF (GAUZE/BANDAGES/DRESSINGS) ×2 IMPLANT
CORD BIPOLAR FORCEPS 12FT (ELECTRODE) IMPLANT
COVER WAND RF STERILE (DRAPES) IMPLANT
CUFF TOURN SGL QUICK 18X4 (TOURNIQUET CUFF) IMPLANT
DRAPE SURG 17X23 STRL (DRAPES) ×2 IMPLANT
DRSG XEROFORM 1X8 (GAUZE/BANDAGES/DRESSINGS) ×2 IMPLANT
ELECT REM PT RETURN 9FT ADLT (ELECTROSURGICAL)
ELECTRODE REM PT RTRN 9FT ADLT (ELECTROSURGICAL) IMPLANT
GAUZE PACKING IODOFORM 1/4X15 (PACKING) IMPLANT
GAUZE SPONGE 4X4 12PLY STRL (GAUZE/BANDAGES/DRESSINGS) ×2 IMPLANT
GAUZE XEROFORM 1X8 LF (GAUZE/BANDAGES/DRESSINGS) ×2 IMPLANT
GLOVE BIOGEL M 8.0 STRL (GLOVE) ×4 IMPLANT
GOWN STRL REUS W/ TWL LRG LVL3 (GOWN DISPOSABLE) ×2 IMPLANT
GOWN STRL REUS W/TWL LRG LVL3 (GOWN DISPOSABLE) ×2
HANDPIECE INTERPULSE COAX TIP (DISPOSABLE)
IV NS IRRIG 3000ML ARTHROMATIC (IV SOLUTION) IMPLANT
KIT BASIN OR (CUSTOM PROCEDURE TRAY) ×2 IMPLANT
KIT TURNOVER KIT B (KITS) ×2 IMPLANT
MANIFOLD NEPTUNE II (INSTRUMENTS) ×2 IMPLANT
NEEDLE HYPO 25GX1X1/2 BEV (NEEDLE) IMPLANT
NS IRRIG 1000ML POUR BTL (IV SOLUTION) ×2 IMPLANT
PACK ORTHO EXTREMITY (CUSTOM PROCEDURE TRAY) ×2 IMPLANT
PAD ARMBOARD 7.5X6 YLW CONV (MISCELLANEOUS) ×4 IMPLANT
PAD CAST 4YDX4 CTTN HI CHSV (CAST SUPPLIES) IMPLANT
PADDING CAST COTTON 4X4 STRL (CAST SUPPLIES)
SET CYSTO W/LG BORE CLAMP LF (SET/KITS/TRAYS/PACK) IMPLANT
SET HNDPC FAN SPRY TIP SCT (DISPOSABLE) IMPLANT
SLING ARM FOAM STRAP SML (SOFTGOODS) ×2 IMPLANT
SOAP 2 % CHG 4 OZ (WOUND CARE) ×2 IMPLANT
SPONGE LAP 18X18 RF (DISPOSABLE) IMPLANT
SPONGE LAP 4X18 RFD (DISPOSABLE) ×2 IMPLANT
SWAB CULTURE ESWAB REG 1ML (MISCELLANEOUS) IMPLANT
SYR CONTROL 10ML LL (SYRINGE) IMPLANT
TOWEL GREEN STERILE (TOWEL DISPOSABLE) ×2 IMPLANT
TOWEL GREEN STERILE FF (TOWEL DISPOSABLE) ×2 IMPLANT
TUBE CONNECTING 12X1/4 (SUCTIONS) ×2 IMPLANT
WATER STERILE IRR 1000ML POUR (IV SOLUTION) ×2 IMPLANT
YANKAUER SUCT BULB TIP NO VENT (SUCTIONS) ×2 IMPLANT

## 2020-12-29 NOTE — Anesthesia Preprocedure Evaluation (Addendum)
Anesthesia Evaluation  Patient identified by MRN, date of birth, ID band Patient awake    Reviewed: Allergy & Precautions, H&P , NPO status , Patient's Chart, lab work & pertinent test results  Airway Mallampati: II  TM Distance: >3 FB Neck ROM: Full    Dental no notable dental hx. (+) Teeth Intact, Dental Advisory Given   Pulmonary asthma , former smoker,    Pulmonary exam normal breath sounds clear to auscultation       Cardiovascular negative cardio ROS   Rhythm:Regular Rate:Normal     Neuro/Psych Anxiety Depression CVA, Residual Symptoms    GI/Hepatic negative GI ROS, Neg liver ROS,   Endo/Other  diabetes, Type 1, Insulin Dependent  Renal/GU negative Renal ROS  negative genitourinary   Musculoskeletal   Abdominal   Peds  Hematology negative hematology ROS (+)   Anesthesia Other Findings   Reproductive/Obstetrics negative OB ROS                            Anesthesia Physical Anesthesia Plan  ASA: III  Anesthesia Plan: MAC and Regional   Post-op Pain Management:    Induction: Intravenous  PONV Risk Score and Plan: 2 and Ondansetron and Treatment may vary due to age or medical condition  Airway Management Planned: Simple Face Mask  Additional Equipment:   Intra-op Plan:   Post-operative Plan:   Informed Consent: I have reviewed the patients History and Physical, chart, labs and discussed the procedure including the risks, benefits and alternatives for the proposed anesthesia with the patient or authorized representative who has indicated his/her understanding and acceptance.     Dental advisory given  Plan Discussed with: CRNA  Anesthesia Plan Comments:         Anesthesia Quick Evaluation

## 2020-12-29 NOTE — ED Notes (Signed)
OB Rapid Response at bedside monitoring patient.

## 2020-12-29 NOTE — Progress Notes (Signed)
RROB called by ED RN for 27 wk patient who is being prepped for the OR and needs monitored. No further information obtained at this time.   RROB en route to ED to bed 7.

## 2020-12-29 NOTE — Anesthesia Postprocedure Evaluation (Signed)
Anesthesia Post Note  Patient: Jo Pittman  Procedure(s) Performed: IRRIGATION AND DEBRIDEMENT EXTREMITY (Right Hand)     Patient location during evaluation: PACU Anesthesia Type: Regional and MAC Level of consciousness: awake and alert Pain management: pain level controlled Vital Signs Assessment: post-procedure vital signs reviewed and stable Respiratory status: spontaneous breathing, nonlabored ventilation and respiratory function stable Cardiovascular status: stable and blood pressure returned to baseline Postop Assessment: no apparent nausea or vomiting Anesthetic complications: no   No complications documented.  Last Vitals:  Vitals:   12/29/20 2049 12/29/20 2310  BP: 134/88 121/84  Pulse: 90 85  Resp: 17 11  Temp:  (!) 36.1 C  SpO2: 100% 100%    Last Pain:  Vitals:   12/29/20 2310  TempSrc:   PainSc: 0-No pain                 Kevonte Vanecek,W. EDMOND

## 2020-12-29 NOTE — Discharge Instructions (Addendum)
-  Head straight to [facility] emergency room for further evaluation and management.  We are concerned that you have an infection of your tendon, which can threaten the use of your hand.  Try to avoid eating as a procedure may need to be performed.

## 2020-12-29 NOTE — Consult Note (Signed)
Reason for Consult:infection Referring Physician: ER  CC:I was bitten by a dog .  HPI:  Jo Pittman is an 30 y.o. right handed female who presents with pain, swelling after a dog bite she sustained in April.  Recently finger became swollen and painful, she was started on antibiotics (Augmentin) , without significant improvement.    Pain is rated at   8 /10 and is described as sharp.  Pain is constant.  Pain is made better by rest/immobilization, worse with motion.   Associated signs/symptoms:  Pt is [redacted] wks pregnant Previous treatment:    Past Medical History:  Diagnosis Date  . Back pain, chronic   . Diabetes mellitus without complication (HCC)    type I  . Sickle cell anemia (HCC)    HX of, no longer  . Stroke [facility]) 2008   R sided weakness/aneurism    Past Surgical History:  Procedure Laterality Date  . APPENDECTOMY    . BONE MARROW TRANSPLANT    . CHOLECYSTECTOMY    . UPPER GASTROINTESTINAL ENDOSCOPY      Family History  Problem Relation Age of Onset  . Cancer Paternal Aunt   . Cancer Paternal Grandmother   . Stroke Paternal Grandmother   . Colon cancer Neg Hx   . Esophageal cancer Neg Hx   . Pancreatic cancer Neg Hx   . Rectal cancer Neg Hx   . Stomach cancer Neg Hx     Social History:  reports that she quit smoking about 5 years ago. Her smoking use included cigarettes. She has never used smokeless tobacco. She reports current alcohol use of about 4.0 standard drinks of alcohol per week. She reports current drug use. Drug: Marijuana.  Allergies:  Allergies  Allergen Reactions  . Vancomycin Itching and Rash    redman syndrome  . Lisinopril   . Carafate [Sucralfate] Itching  . Morphine And Related Itching    Itching when given IV     Medications: I have reviewed the patient's current medications.  Results for orders placed or performed during the hospital encounter of 12/29/20 (from the past 48 hour(s))  Basic metabolic panel     Status: Abnormal    Collection Time: 12/29/20  8:26 PM  Result Value Ref Range   Sodium 134 (L) 135 - 145 mmol/L   Potassium 3.3 (L) 3.5 - 5.1 mmol/L   Chloride 105 98 - 111 mmol/L   CO2 21 (L) 22 - 32 mmol/L   Glucose, Bld 99 70 - 99 mg/dL    Comment: Glucose reference range applies only to samples taken after fasting for at least 8 hours.   BUN 6 6 - 20 mg/dL   Creatinine, Ser 6.29 0.44 - 1.00 mg/dL   Calcium 8.6 (L) 8.9 - 10.3 mg/dL   GFR, Estimated >52 >84 mL/min    Comment: (NOTE) Calculated using the CKD-EPI Creatinine Equation (2021)    Anion gap 8 5 - 15    Comment: Performed at [facility], [address], [place], [place] [address]  CBC with Differential     Status: Abnormal   Collection Time: 12/29/20  8:26 PM  Result Value Ref Range   WBC 14.3 (H) 4.0 - 10.5 K/uL   RBC 2.24 (L) 3.87 - 5.11 MIL/uL   Hemoglobin 8.4 (L) 12.0 - 15.0 g/dL   HCT 01.0 (L) 27.2 - 53.6 %   MCV 108.9 (H) 80.0 - 100.0 fL   MCH 37.5 (H) 26.0 - 34.0 pg  MCHC 34.4 30.0 - 36.0 g/dL   RDW 34.3 56.8 - 61.6 %   Platelets 209 150 - 400 K/uL   nRBC 0.0 0.0 - 0.2 %   Neutrophils Relative % 64 %   Neutro Abs 9.1 (H) 1.7 - 7.7 K/uL   Lymphocytes Relative 24 %   Lymphs Abs 3.4 0.7 - 4.0 K/uL   Monocytes Relative 10 %   Monocytes Absolute 1.5 (H) 0.1 - 1.0 K/uL   Eosinophils Relative 0 %   Eosinophils Absolute 0.1 0.0 - 0.5 K/uL   Basophils Relative 0 %   Basophils Absolute 0.0 0.0 - 0.1 K/uL   Immature Granulocytes 2 %   Abs Immature Granulocytes 0.24 (H) 0.00 - 0.07 K/uL    Comment: Performed at [facility], [address] [place], [place] [address]  Resp Panel by RT-PCR (Flu A&B, Covid) Nasopharyngeal Swab     Status: None   Collection Time: 12/29/20  8:30 PM   Specimen: Nasopharyngeal Swab; Nasopharyngeal(NP) swabs in vial transport medium  Result Value Ref Range   SARS Coronavirus 2 by RT PCR NEGATIVE NEGATIVE    Comment: (NOTE) SARS-CoV-2 target nucleic acids are NOT DETECTED.  The SARS-CoV-2  RNA is generally detectable in upper respiratory specimens during the acute phase of infection. The lowest concentration of SARS-CoV-2 viral copies this assay can detect is 138 copies/mL. A negative result does not preclude SARS-Cov-2 infection and should not be used as the sole basis for treatment or other patient management decisions. A negative result may occur with  improper specimen collection/handling, submission of specimen other than nasopharyngeal swab, presence of viral mutation(s) within the areas targeted by this assay, and inadequate number of viral copies(<138 copies/mL). A negative result must be combined with clinical observations, patient history, and epidemiological information. The expected result is Negative.  Fact Sheet for Patients:  BloggerCourse.com  Fact Sheet for Healthcare Providers:  SeriousBroker.it  This test is no t yet approved or cleared by the [place] FDA and  has been authorized for detection and/or diagnosis of SARS-CoV-2 by FDA under an Emergency Use Authorization (EUA). This EUA will remain  in effect (meaning this test can be used) for the duration of the COVID-19 declaration under Section 564(b)(1) of the Act, 21 U.S.C.section 360bbb-3(b)(1), unless the authorization is terminated  or revoked sooner.       Influenza A by PCR NEGATIVE NEGATIVE   Influenza B by PCR NEGATIVE NEGATIVE    Comment: (NOTE) The Xpert Xpress SARS-CoV-2/FLU/RSV plus assay is intended as an aid in the diagnosis of influenza from Nasopharyngeal swab specimens and should not be used as a sole basis for treatment. Nasal washings and aspirates are unacceptable for Xpert Xpress SARS-CoV-2/FLU/RSV testing.  Fact Sheet for Patients: BloggerCourse.com  Fact Sheet for Healthcare Providers: SeriousBroker.it  This test is not yet approved or cleared by the [place]  FDA and has been authorized for detection and/or diagnosis of SARS-CoV-2 by FDA under an Emergency Use Authorization (EUA). This EUA will remain in effect (meaning this test can be used) for the duration of the COVID-19 declaration under Section 564(b)(1) of the Act, 21 U.S.C. section 360bbb-3(b)(1), unless the authorization is terminated or revoked.  Performed at [facility], [address], [place], [place] [address]   I-Stat beta hCG blood, ED     Status: Abnormal   Collection Time: 12/29/20  8:43 PM  Result Value Ref Range   I-stat hCG, quantitative >2,000.0 (H) <5 mIU/mL  Comment 3            Comment:   GEST. AGE      CONC.  (mIU/mL)   <=1 WEEK        5 - 50     2 WEEKS       50 - 500     3 WEEKS       100 - 10,000     4 WEEKS     1,000 - 30,000        FEMALE AND NON-PREGNANT FEMALE:     LESS THAN 5 mIU/mL     No results found.  Pertinent items are noted in HPI. Temp:  [98.6 F (37 C)] 98.6 F (37 C) (05/16 2004) Pulse Rate:  [80-99] 90 (05/16 2049) Resp:  [17-18] 17 (05/16 2049) BP: (134-161)/(75-90) 134/88 (05/16 2049) SpO2:  [100 %] 100 % (05/16 2049) Weight:  [54.9 kg] 54.9 kg (05/16 2004) General appearance: alert and cooperative Resp: clear to auscultation bilaterally Cardio: regular rate and rhythm GI: soft, non-tender; bowel sounds normal; no masses,  no organomegaly Extremities: extremities normal, atraumatic, no cyanosis or edema Except for RLF, palm with erythema, tenderness along tendon sheath, not able to fully flex/extend  Assessment: Flexor tenosynovitis RLF Plan: Needs I&D I have discussed this treatment plan in detail with patient , including the risks of the recommended surgery, the benefits and the alternatives.  The patient understands that additional treatment may be necessary.  Nicolae Vasek Harle Battiest 12/29/2020, 10:19 PM

## 2020-12-29 NOTE — Transfer of Care (Signed)
Immediate Anesthesia Transfer of Care Note  Patient: Niana Martorana  Procedure(s) Performed: IRRIGATION AND DEBRIDEMENT EXTREMITY (Right Hand)  Patient Location: PACU  Anesthesia Type:MAC combined with regional for post-op pain  Level of Consciousness: awake  Airway & Oxygen Therapy: Patient Spontanous Breathing  Post-op Assessment: Report given to RN and Post -op Vital signs reviewed and stable  Post vital signs: Reviewed and stable  Last Vitals:  Vitals Value Taken Time  BP    Temp    Pulse 85 12/29/20 2310  Resp 10 12/29/20 2310  SpO2 100 % 12/29/20 2310  Vitals shown include unvalidated device data.  Last Pain:  Vitals:   12/29/20 2046  TempSrc:   PainSc: 6          Complications: No complications documented.

## 2020-12-29 NOTE — Anesthesia Procedure Notes (Signed)
Anesthesia Regional Block: Supraclavicular block   Pre-Anesthetic Checklist: ,, timeout performed, Correct Patient, Correct Site, Correct Laterality, Correct Procedure, Correct Position, site marked, Risks and benefits discussed, pre-op evaluation,  At surgeon's request and post-op pain management  Laterality: Right  Prep: Maximum Sterile Barrier Precautions used, chloraprep       Needles:  Injection technique: Single-shot  Needle Type: Echogenic Stimulator Needle     Needle Length: 4cm  Needle Gauge: 22     Additional Needles:   Procedures:,,,, ultrasound used (permanent image in chart),,,,  Narrative:  Start time: 12/29/2020 10:08 PM End time: 12/29/2020 10:18 PM Injection made incrementally with aspirations every 5 mL. Anesthesiologist: Gaynelle Adu, MD  Additional Notes: 2% Lidocaine skin wheel.

## 2020-12-29 NOTE — ED Provider Notes (Signed)
[facility] Provider Note   CSN: [id number] Arrival date & time: 12/29/20  1958     History Chief Complaint  Patient presents with  . Hand Pain    Jamell Opfer is a 30 y.o. female.  30 year old female with prior medical history as detailed below presents for evaluation.  Patient was seen in urgent care earlier today for suspected infection of right hand and finger.  Dr. Izora Ribas of [facility] is aware of case with plan to take patient to the OR for incision and debridement.  Patient reports dog bite to the infected area approximately 1 month ago.  She reports increased pain to the hand and finger over the last week.  She denies fever.  She denies other complaint.  The history is provided by the patient and medical records.  Hand Pain This is a new problem. The current episode started more than 1 week ago. The problem occurs constantly. The problem has not changed since onset.Nothing aggravates the symptoms. Nothing relieves the symptoms.       Past Medical History:  Diagnosis Date  . Back pain, chronic   . Diabetes mellitus without complication (HCC)    type I  . Sickle cell anemia (HCC)    HX of, no longer  . Stroke [facility]) 2008   R sided weakness/aneurism    Patient Active Problem List   Diagnosis Date Noted  . GERD (gastroesophageal reflux disease) 03/12/2016  . Major depression, recurrent, chronic (HCC) 11/13/2015  . Generalized anxiety disorder 11/13/2015  . Caffeine abuse, continuous (HCC) 11/13/2015  . Cough 09/30/2015  . Asthma 09/30/2015  . Chronic pain disorder 09/09/2015  . Low back pain with sciatica 09/09/2015  . Type 1 diabetes mellitus without complication (HCC) 09/09/2015  . Depression 09/09/2015  . Anxiety state 09/09/2015  . Vitamin D deficiency 09/09/2015    Past Surgical History:  Procedure Laterality Date  . APPENDECTOMY    . BONE MARROW TRANSPLANT    . CHOLECYSTECTOMY    . UPPER GASTROINTESTINAL ENDOSCOPY       OB  History    Gravida  1   Para      Term      Preterm      AB      Living        SAB      IAB      Ectopic      Multiple      Live Births              Family History  Problem Relation Age of Onset  . Cancer Paternal Aunt   . Cancer Paternal Grandmother   . Stroke Paternal Grandmother   . Colon cancer Neg Hx   . Esophageal cancer Neg Hx   . Pancreatic cancer Neg Hx   . Rectal cancer Neg Hx   . Stomach cancer Neg Hx     Social History   Tobacco Use  . Smoking status: Former Smoker    Types: Cigarettes    Quit date: 05/16/2015    Years since quitting: 5.6  . Smokeless tobacco: Never Used  Substance Use Topics  . Alcohol use: Yes    Alcohol/week: 4.0 standard drinks    Types: 4 Shots of liquor per week    Comment: occ  . Drug use: Yes    Types: Marijuana    Home Medications Prior to Admission medications   Medication Sig Start Date End Date Taking? Authorizing Provider  albuterol (PROVENTIL HFA;VENTOLIN  HFA) 108 (90 Base) MCG/ACT inhaler Inhale 2 puffs into the lungs every 6 (six) hours as needed for wheezing or shortness of breath. 09/13/16   [place], Betty G, MD  budesonide-formoterol [facility]) 80-4.5 MCG/ACT inhaler Inhale 2 puffs into the lungs 2 (two) times daily. 05/14/16   [place], Betty G, MD  cyclobenzaprine (FLEXERIL) 10 MG tablet Take 10 mg by mouth 3 (three) times daily as needed for muscle spasms.    [provider]  Insulin Human (INSULIN PUMP) SOLN Inject 1 each into the skin continuous. "Novolog"    [provider]  metoCLOPramide (REGLAN) 5 MG tablet Take 1 tablet (5 mg total) by mouth 3 (three) times daily before meals. 05/07/16   Sherrilyn Rist, MD  omeprazole (PRILOSEC) 20 MG capsule TAKE 1 CAPSULE(20 MG) BY MOUTH DAILY 02/08/17   [place], Betty G, MD  oxyCODONE-acetaminophen (PERCOCET/ROXICET) 5-325 MG tablet Take 1 tablet by mouth every 6 (six) hours as needed for severe pain.    [provider]  Vitamin D,  Ergocalciferol, (DRISDOL) 50000 units CAPS capsule Take 50,000 Units by mouth every Wednesday.     [provider]    Allergies    Vancomycin, Lisinopril, Carafate [sucralfate], and Morphine and related  Review of Systems   Review of Systems  All other systems reviewed and are negative.   Physical Exam Updated Vital Signs BP 134/88 (BP Location: Right Arm)   Pulse 90   Temp 98.6 F (37 C) (Oral)   Resp 17   Ht 5\' 6"  (1.676 m)   Wt 54.9 kg   SpO2 100%   BMI 19.53 kg/m   Physical Exam Vitals and nursing note reviewed.  Constitutional:      General: She is not in acute distress.    Appearance: She is well-developed.  HENT:     Head: Normocephalic and atraumatic.  Eyes:     Conjunctiva/sclera: Conjunctivae normal.     Pupils: Pupils are equal, round, and reactive to light.  Cardiovascular:     Rate and Rhythm: Normal rate and regular rhythm.     Heart sounds: Normal heart sounds.  Pulmonary:     Effort: Pulmonary effort is normal. No respiratory distress.     Breath sounds: Normal breath sounds.  Abdominal:     General: There is no distension.     Palpations: Abdomen is soft.     Tenderness: There is no abdominal tenderness.     Comments: Gravid - 27 weeks   Musculoskeletal:        General: No deformity. Normal range of motion.     Cervical back: Normal range of motion and neck supple.     Comments: R middle finger is diffusely swollen and tender, worse over middle phalanx. ROM very limited due to swelling and discomfort. Small bite wound visible middle phalanx palmar side.   Skin:    General: Skin is warm and dry.  Neurological:     Mental Status: She is alert and oriented to person, place, and time.         ED Results / Procedures / Treatments   Labs (all labs ordered are listed, but only abnormal results are displayed) Labs Reviewed  BASIC METABOLIC PANEL - Abnormal; Notable for the following components:      Result Value   Sodium 134 (*)     Potassium 3.3 (*)    CO2 21 (*)    Calcium 8.6 (*)    All other components within normal limits  CBC WITH DIFFERENTIAL/PLATELET - Abnormal; Notable for the following components:   WBC 14.3 (*)    RBC 2.24 (*)    Hemoglobin 8.4 (*)    HCT 24.4 (*)    MCV 108.9 (*)    MCH 37.5 (*)    Neutro Abs 9.1 (*)    Monocytes Absolute 1.5 (*)    Abs Immature Granulocytes 0.24 (*)    All other components within normal limits  I-STAT BETA HCG BLOOD, ED ([facility], [facility], [facility] ONLY) - Abnormal; Notable for the following components:   I-stat hCG, quantitative >2,000.0 (*)    All other components within normal limits  RESP PANEL BY RT-PCR (FLU A&B, COVID) [id number]    EKG None  Radiology No results found.  Procedures Procedures   Medications Ordered in ED Medications - No data to display  ED Course  I have reviewed the triage vital signs and the nursing notes.  Pertinent labs & imaging results that were available during my care of the patient were reviewed by me and considered in my medical decision making (see chart for details).    MDM Rules/Calculators/A&P                          MDM  MSE complete  Joesphine Schemm was evaluated in Emergency Department on 12/29/2020 for the symptoms described in the history of present illness. She was evaluated in the context of the global COVID-19 pandemic, which necessitated consideration that the patient might be at risk for infection with the SARS-CoV-2 virus that causes COVID-19. Institutional protocols and algorithms that pertain to the evaluation of patients at risk for COVID-19 are in a state of rapid change based on information released by regulatory bodies including the CDC and federal and state organizations. These policies and algorithms were followed during the patient's care in the ED.   Patient presents with likely right hand infection.  Patient with planned trip to OR with Dr. Izora Ribas.   Final Clinical Impression(s) / ED Diagnoses Final diagnoses:   Infected finger    Rx / DC Orders ED Discharge Orders    None       Wynetta Fines, MD 12/29/20 2204

## 2020-12-29 NOTE — ED Provider Notes (Addendum)
[facility]    CSN: [id number] Arrival date & time: 12/29/20  1801      History   Chief Complaint Chief Complaint  Patient presents with  . Hand Pain  . Hand Swelling    HPI Jo Pittman is a 30 y.o. female presenting with right hand pain and swelling following an a dog bite 12/04/2020.  Medical history type 1 diabetes, sickle cell anemia (no longer an issue per pt), stroke/aneurysm, bipolar, depression. Presents with new onset of swelling and pain of the right middle finger for about 10 days, getting worse. Pain radiating into palm and wrist.  The dog is her own, it is up-to-date on its vaccines.  Tetanus shot was administered 1 month ago at her initial visit.  She did have the initial wound treated and completed a round of antibiotics- Augmentin.  States the initial swelling and pain improved on its own, before current symptoms.  States that the finger is excruciating if she tries to move it, and range of motion is limited.  Denies fever/chills.  Denies discharge from the area.  HPI  Past Medical History:  Diagnosis Date  . Back pain, chronic   . Diabetes mellitus without complication (HCC)    type I  . Sickle cell anemia (HCC)    HX of, no longer  . Stroke [facility]) 2008   R sided weakness/aneurism    Patient Active Problem List   Diagnosis Date Noted  . GERD (gastroesophageal reflux disease) 03/12/2016  . Major depression, recurrent, chronic (HCC) 11/13/2015  . Generalized anxiety disorder 11/13/2015  . Caffeine abuse, continuous (HCC) 11/13/2015  . Cough 09/30/2015  . Asthma 09/30/2015  . Chronic pain disorder 09/09/2015  . Low back pain with sciatica 09/09/2015  . Type 1 diabetes mellitus without complication (HCC) 09/09/2015  . Depression 09/09/2015  . Anxiety state 09/09/2015  . Vitamin D deficiency 09/09/2015    Past Surgical History:  Procedure Laterality Date  . APPENDECTOMY    . BONE MARROW TRANSPLANT    . CHOLECYSTECTOMY    . UPPER  GASTROINTESTINAL ENDOSCOPY      OB History    Gravida  1   Para      Term      Preterm      AB      Living        SAB      IAB      Ectopic      Multiple      Live Births               Home Medications    Prior to Admission medications   Medication Sig Start Date End Date Taking? Authorizing Provider  albuterol (PROVENTIL HFA;VENTOLIN HFA) 108 (90 Base) MCG/ACT inhaler Inhale 2 puffs into the lungs every 6 (six) hours as needed for wheezing or shortness of breath. 09/13/16   [place], Betty G, MD  budesonide-formoterol [facility]) 80-4.5 MCG/ACT inhaler Inhale 2 puffs into the lungs 2 (two) times daily. 05/14/16   [place], Betty G, MD  cyclobenzaprine (FLEXERIL) 10 MG tablet Take 10 mg by mouth 3 (three) times daily as needed for muscle spasms.    [provider]  Insulin Human (INSULIN PUMP) SOLN Inject 1 each into the skin continuous. "Novolog"    [provider]  metoCLOPramide (REGLAN) 5 MG tablet Take 1 tablet (5 mg total) by mouth 3 (three) times daily before meals. 05/07/16   Sherrilyn Rist, MD  omeprazole (PRILOSEC) 20  MG capsule TAKE 1 CAPSULE(20 MG) BY MOUTH DAILY 02/08/17   [place], Betty G, MD  oxyCODONE-acetaminophen (PERCOCET/ROXICET) 5-325 MG tablet Take 1 tablet by mouth every 6 (six) hours as needed for severe pain.    [provider]  Vitamin D, Ergocalciferol, (DRISDOL) 50000 units CAPS capsule Take 50,000 Units by mouth every Wednesday.     [provider]    Family History Family History  Problem Relation Age of Onset  . Cancer Paternal Aunt   . Cancer Paternal Grandmother   . Stroke Paternal Grandmother   . Colon cancer Neg Hx   . Esophageal cancer Neg Hx   . Pancreatic cancer Neg Hx   . Rectal cancer Neg Hx   . Stomach cancer Neg Hx     Social History Social History   Tobacco Use  . Smoking status: Former Smoker    Types: Cigarettes    Quit date: 05/16/2015    Years since quitting: 5.6  .  Smokeless tobacco: Never Used  Substance Use Topics  . Alcohol use: Yes    Alcohol/week: 4.0 standard drinks    Types: 4 Shots of liquor per week    Comment: occ  . Drug use: Yes    Types: Marijuana     Allergies   Vancomycin, Lisinopril, Carafate [sucralfate], and Morphine and related   Review of Systems Review of Systems  Skin: Positive for wound.  All other systems reviewed and are negative.    Physical Exam Triage Vital Signs ED Triage Vitals  Enc Vitals Group     BP 12/29/20 1843 134/90     Pulse Rate 12/29/20 1843 85     Resp 12/29/20 1843 18     Temp 12/29/20 1843 98.6 F (37 C)     Temp Source 12/29/20 1843 Oral     SpO2 12/29/20 1843 100 %     Weight --      Height --      Head Circumference --      Peak Flow --      Pain Score 12/29/20 1845 10     Pain Loc --      Pain Edu? --      Excl. in GC? --    No data found.  Updated Vital Signs BP 134/90 (BP Location: Right Arm)   Pulse 85   Temp 98.6 F (37 C) (Oral)   Resp 18   SpO2 100%   Visual Acuity Right Eye Distance:   Left Eye Distance:   Bilateral Distance:    Right Eye Near:   Left Eye Near:    Bilateral Near:     Physical Exam Vitals reviewed.  Constitutional:      General: She is not in acute distress.    Appearance: Normal appearance. She is not ill-appearing or diaphoretic.  HENT:     Head: Normocephalic and atraumatic.  Cardiovascular:     Rate and Rhythm: Normal rate and regular rhythm.     Heart sounds: Normal heart sounds.  Pulmonary:     Effort: Pulmonary effort is normal.     Breath sounds: Normal breath sounds.  Skin:    General: Skin is warm.     Capillary Refill: Capillary refill takes less than 2 seconds.     Comments: (See images below). R middle finger is diffusely swollen and tender, worse over middle phalanx. ROM very limited due to swelling and discomfort. Small bite wound visible middle phalanx palmar side. No discharge able to be  expressed. No warmth,  eythema. Palm is also TTP. Radial pulse 2+, cap refill <2 seconds.   Neurological:     General: No focal deficit present.     Mental Status: She is alert and oriented to person, place, and time.  Psychiatric:        Mood and Affect: Mood normal.        Behavior: Behavior normal.        Thought Content: Thought content normal.        Judgment: Judgment normal.           UC Treatments / Results  Labs (all labs ordered are listed, but only abnormal results are displayed) Labs Reviewed - No data to display  EKG   Radiology No results found.  Procedures Procedures (including critical care time)  Medications Ordered in UC Medications - No data to display  Initial Impression / Assessment and Plan / UC Course  I have reviewed the triage vital signs and the nursing notes.  Pertinent labs & imaging results that were available during my care of the patient were reviewed by me and considered in my medical decision making (see chart for details).     This patient is a 30 year old female presenting with possible flexor tenosynovitis following dog bite that occurred 3 weeks ago.  She is afebrile, nontachycardic.  On exam, significant swelling and tenderness along the third right digit. This patient is a type I diabetic and is [redacted] weeks pregnant. She was initially treated with Augmentin after the bite.  Her Tdap is up-to-date.  The dog's vaccines are up-to-date.  Called hand specialist on-call, Dr. Izora Ribas, who is in agreement this patient should head straight to the emergency room for further evaluation and management.  She may require a surgical debridement.  She is hemodynamically stable for transport in personal vehicle at this time.  She has not eaten since 2 PM (6 hours ago).   Coding this as a Level 5 as this patient was admitted to hospital for emergent surgical debridement. Without this procedure, she was at risk of losing her finger and hand.   Final Clinical Impressions(s) /  UC Diagnoses   Final diagnoses:  Swelling of right middle finger  Dog bite, subsequent encounter  [redacted] weeks gestation of pregnancy  Type 1 diabetes mellitus with other specified complication [facility])     Discharge Instructions     -Head straight to [facility] emergency room for further evaluation and management.  We are concerned that you have an infection of your tendon, which can threaten the use of your hand.  Try to avoid eating as a procedure may need to be performed.    ED Prescriptions    None     PDMP not reviewed this encounter.   Rhys Martini, PA-C 12/29/20 2007    Rhys Martini, PA-C 12/30/20 [id number]

## 2020-12-29 NOTE — ED Triage Notes (Signed)
Pt presents with right hand swelling & pain post animal bite on 12/04/2020; pt states she had initial wound treated and completed a round of antibiotcs.

## 2020-12-29 NOTE — Progress Notes (Signed)
RROB contacted by Anesthesia for arrival of patient to PACU. RROB en route for doppler of FHT.   Present at bedside of PACU at 2305. FHT 134 at 2310.  Dr. Alysia Penna contacted regarding patient status.  Patient received regional anesthesia. Dr. Alysia Penna instructed patient to follow up with routine OBGYN care.   Patient verbalized understanding. Patient will be discharged from PACU per PACU RN.

## 2020-12-29 NOTE — Progress Notes (Signed)
RROB arrived in ED at 2040, G1P0 patient, 27 weeks attached to EFM at 2042.  Reports dog bit her 3 weeks ago on her right hand.  She reported to urgent care at that time and received treatment, but did not have x-rays per the patient.  Hand swelling and pain progressively got worse and patient reported to urgent care again tonight.  Patient instructed to report to ED for further assessment.    Patient receives OB care through [facility].  Faculty attending, Dr. Alysia Penna, contacted at 2046.  Patient denies uc's, bleeding, and no leaking of fluid. Reports positive fetal movement. Dr. Alysia Penna aware of the plan for patient to go to OR and cleared OB following NST in ED.  Nursing staff will contact RROB following surgery for doppler of FHT.  ED RN made aware.  2115 RROB left bedside following reactive fetal tracing.

## 2020-12-29 NOTE — Discharge Instructions (Signed)
In am, remove dressing, remove 2 small pieces of yellow material/packing from wounds, may wash hand with warm soapy water and keep covered.

## 2020-12-30 ENCOUNTER — Encounter (HOSPITAL_COMMUNITY): Payer: Self-pay | Admitting: General Surgery

## 2020-12-30 NOTE — Op Note (Signed)
NAME: Jo Pittman, Jo Pittman MEDICAL RECORD NO: [id number] ACCOUNT NO: [id number] DATE OF BIRTH: 09-Mar-1991 FACILITY: [facility] LOCATION: [facility] PHYSICIAN: Mycheal Veldhuizen C. Izora Ribas, MD  Operative Report   DATE OF PROCEDURE: 12/29/2020  PREOPERATIVE DIAGNOSIS:  Infection and presumed flexor tenosynovitis of the right long finger.  POSTOPERATIVE DIAGNOSIS:  Infection and presumed flexor tenosynovitis of the right long finger.  PROCEDURE: 1.  Incision and drainage of the flexor tendon sheath of the right long finger. 2.  Release of A1 pulley, right long finger.  INDICATIONS:  The patient is a 30 year old female who was bitten by a dog approximately a month ago or just shy of a month ago.  She has had a waxing and waning course with intermittent swelling and pain of that finger.  Recently, she was on a course of  Augmentin and failed to progress and the finger actually became worse and she presented to the ER, today, on evaluation, she had evidence of flexor tenosynovitis.  Risks, benefits and alternatives of I and D were discussed with her.  She agreed with this  course of action.  Consent was obtained.  Of note, she is pregnant.  She received fetal monitoring prior to going to the OR and precautions were taken with anesthesia staff to avoid any complications.  Of note, she received an upper extremity block;  therefore, general anesthesia was not necessary.  DESCRIPTION OF PROCEDURE:  The patient again was taken to the operating room and placed supine on the operating room table.  Some IV sedation was administered.  The upper extremity block was very successful.  The right upper extremity was prepped and  draped in normal sterile fashion.  A small tangential incision overlying the A1 pulley was performed.  Dissection was carried down to the flexor tendon sheath.  The sheath was opened.  There was some yellowish fluid that was clearly abnormal.  This was  sent for culture.  The remaining A1 pulley was released to  gain access to the entire flexor tendon sheath.  A counter incision was made distally overlying the distal insertion of the tendon.  Again, dissection was carried to the tendon.  The tendon  sheath was opened.  An angiocatheter was inserted and irrigation from a proximal to distal direction was performed with both normal saline and IrriSept solution.  The finger was milked from the palm into the incision.  There did not appear to be any  gross purulence in the carpal canal.  Further irrigation of the proximal incision site was performed again with the IrriSept solution.  The tourniquet, Esmarch around the wrist was released.  Hemostasis was controlled.  The wound was loosely approximated  and packed with Xeroform gauze and a sterile dressing was applied.  ESTIMATED BLOOD LOSS:  Minimal.  No acute complications.  Again, cultures were sent.  The patient tolerated the procedure well and was taken to recovery room stable.   SHW D: 12/29/2020 11:11:45 pm T: 12/30/2020 7:47:00 am  JOB: [id number]/ [id number]

## 2021-01-04 LAB — AEROBIC/ANAEROBIC CULTURE W GRAM STAIN (SURGICAL/DEEP WOUND)
Culture: NO GROWTH
Gram Stain: NONE SEEN

## 2021-01-06 NOTE — Op Note (Signed)
NAME: Jo Pittman, SUELL MEDICAL RECORD NO: [id number] ACCOUNT NO: [id number] DATE OF BIRTH: 1991/01/04 FACILITY: [facility] LOCATION: [facility] PHYSICIAN: Bennie Scaff C. Izora Ribas, MD  Operative Report   DATE OF PROCEDURE: 12/29/2020  PREOPERATIVE DIAGNOSIS:  Flexor tenosynovitis of the right long finger.  POSTOPERATIVE DIAGNOSIS:  Flexor tenosynovitis of the right long finger.  PROCEDURE: 1.  Incision and drainage of flexor tendon sheath of the right long finger. 2.  Incision of the A1 pulley, right long finger.  INDICATIONS:  The patient is a 30 year old female who presented to the ER with signs and symptoms of flexor tenosynovitis of her right long finger.  On examination, I felt that she had this and urgent operation was necessary.  She agreed with this course  of action. Consent was obtained.  DESCRIPTION OF PROCEDURE:  The patient was taken to the operating room, placed supine on the operating room table.  Preoperative, anesthesia had been provided an upper extremity block.  The right upper extremity was prepped and draped in the normal  sterile fashion.  Timeout was performed.  An incision overlying the A1 pulley at the base of the right long finger was then made.  Careful dissection was carried down to the flexor tendon sheath.  The sheath was opened.  The A1 pulley was released.   Immediately, there was some turbid type fluid encountered.  A counter incision was made distally overlying the distal insertion of the flexor tendon.  Again, careful dissection was carried down to the tendon sheath.  This was opened.  A 22 gauge  angiocatheter was used from proximal to distal and thoroughly irrigating the sheath.  Pressure on the palm and over the carpal tunnel area did not reveal any additional fluid.  Two loose sutures were placed in the wounds to gently close the wound.  Each  wound was packed with a small piece of iodoform gauze.  A sterile dressing was applied.  The patient tolerated the procedure well  and was taken to the recovery room, stable.   SHW D: 01/06/2021 3:17:33 pm T: 01/06/2021 11:24:00 pm  JOB: [id number]/ [id number]

## 2022-06-28 NOTE — Progress Notes (Signed)
Appointment cancelled - please disregard.
